# Patient Record
Sex: Female | Born: 1985 | Race: Black or African American | Hispanic: No | Marital: Single | Smoking: Never smoker
Health system: Southern US, Community
[De-identification: ages and names within clinical notes are randomized; demographics above are authoritative.]

## PROBLEM LIST (undated history)

## (undated) ENCOUNTER — Inpatient Hospital Stay: Payer: Self-pay

## (undated) DIAGNOSIS — N200 Calculus of kidney: Secondary | ICD-10-CM

## (undated) DIAGNOSIS — G43909 Migraine, unspecified, not intractable, without status migrainosus: Secondary | ICD-10-CM

## (undated) HISTORY — PX: LITHOTRIPSY: SUR834

## (undated) HISTORY — PX: DILATION AND CURETTAGE OF UTERUS: SHX78

---
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
## 2007-08-18 ENCOUNTER — Emergency Department: Payer: Self-pay | Admitting: Emergency Medicine

## 2007-10-12 ENCOUNTER — Emergency Department: Payer: Self-pay | Admitting: Emergency Medicine

## 2007-11-05 ENCOUNTER — Emergency Department: Payer: Self-pay | Admitting: Emergency Medicine

## 2008-03-30 ENCOUNTER — Observation Stay: Payer: Self-pay | Admitting: Obstetrics and Gynecology

## 2008-04-17 ENCOUNTER — Inpatient Hospital Stay: Payer: Self-pay

## 2008-08-27 ENCOUNTER — Emergency Department: Payer: Self-pay | Admitting: Emergency Medicine

## 2008-11-19 LAB — HM PAP SMEAR: HM PAP: NORMAL

## 2009-06-30 ENCOUNTER — Emergency Department: Payer: Self-pay | Admitting: Emergency Medicine

## 2009-07-17 ENCOUNTER — Emergency Department: Payer: Self-pay | Admitting: Emergency Medicine

## 2009-07-19 ENCOUNTER — Emergency Department: Payer: Self-pay | Admitting: Emergency Medicine

## 2010-04-01 ENCOUNTER — Ambulatory Visit: Payer: Self-pay | Admitting: Advanced Practice Midwife

## 2010-04-15 ENCOUNTER — Encounter: Payer: Self-pay | Admitting: Obstetrics and Gynecology

## 2010-05-28 ENCOUNTER — Observation Stay: Payer: Self-pay | Admitting: Unknown Physician Specialty

## 2010-05-30 ENCOUNTER — Inpatient Hospital Stay: Payer: Self-pay | Admitting: Obstetrics & Gynecology

## 2010-07-16 ENCOUNTER — Emergency Department: Payer: Self-pay | Admitting: Emergency Medicine

## 2010-08-03 ENCOUNTER — Emergency Department: Payer: Self-pay | Admitting: Emergency Medicine

## 2010-11-25 ENCOUNTER — Emergency Department: Payer: Self-pay | Admitting: Emergency Medicine

## 2010-11-29 ENCOUNTER — Emergency Department: Payer: Self-pay | Admitting: Emergency Medicine

## 2011-01-13 ENCOUNTER — Emergency Department: Payer: Self-pay | Admitting: Emergency Medicine

## 2011-01-24 ENCOUNTER — Emergency Department: Payer: Self-pay | Admitting: Emergency Medicine

## 2011-06-27 ENCOUNTER — Emergency Department: Payer: Self-pay | Admitting: Emergency Medicine

## 2011-07-05 ENCOUNTER — Emergency Department: Payer: Self-pay | Admitting: Emergency Medicine

## 2011-07-13 ENCOUNTER — Emergency Department: Payer: Self-pay | Admitting: Emergency Medicine

## 2011-07-28 ENCOUNTER — Emergency Department: Payer: Self-pay | Admitting: Emergency Medicine

## 2011-08-15 ENCOUNTER — Emergency Department: Payer: Self-pay | Admitting: Emergency Medicine

## 2011-08-27 ENCOUNTER — Emergency Department: Payer: Self-pay | Admitting: Emergency Medicine

## 2011-10-01 ENCOUNTER — Emergency Department: Payer: Self-pay | Admitting: Emergency Medicine

## 2011-10-18 ENCOUNTER — Emergency Department: Payer: Self-pay | Admitting: *Deleted

## 2011-10-18 LAB — URINALYSIS, COMPLETE
Glucose,UR: NEGATIVE mg/dL (ref 0–75)
Ketone: NEGATIVE
Nitrite: NEGATIVE
Ph: 7 (ref 4.5–8.0)
RBC,UR: 52 /HPF (ref 0–5)
Specific Gravity: 1.023 (ref 1.003–1.030)
Squamous Epithelial: 12

## 2011-10-24 ENCOUNTER — Emergency Department: Payer: Self-pay | Admitting: *Deleted

## 2011-10-24 LAB — URINALYSIS, COMPLETE
Bilirubin,UR: NEGATIVE
Glucose,UR: NEGATIVE mg/dL (ref 0–75)
Nitrite: NEGATIVE
Protein: NEGATIVE
RBC,UR: 27 /HPF (ref 0–5)
Specific Gravity: 1.005 (ref 1.003–1.030)
Squamous Epithelial: 3
WBC UR: 26 /HPF (ref 0–5)

## 2011-10-24 LAB — HCG, QUANTITATIVE, PREGNANCY: Beta Hcg, Quant.: 25578 m[IU]/mL — ABNORMAL HIGH

## 2011-12-25 ENCOUNTER — Observation Stay: Payer: Self-pay | Admitting: Obstetrics and Gynecology

## 2011-12-28 LAB — HM HIV SCREENING LAB: HM HIV Screening: NEGATIVE

## 2012-01-05 ENCOUNTER — Emergency Department: Payer: Self-pay | Admitting: Emergency Medicine

## 2012-01-05 LAB — CBC
HCT: 35 % (ref 35.0–47.0)
HGB: 11.7 g/dL — ABNORMAL LOW (ref 12.0–16.0)
MCH: 33.6 pg (ref 26.0–34.0)
MCHC: 33.3 g/dL (ref 32.0–36.0)
MCV: 101 fL — ABNORMAL HIGH (ref 80–100)
Platelet: 167 10*3/uL (ref 150–440)
RDW: 14.5 % (ref 11.5–14.5)

## 2012-01-05 LAB — COMPREHENSIVE METABOLIC PANEL
Alkaline Phosphatase: 76 U/L (ref 50–136)
Anion Gap: 7 (ref 7–16)
BUN: 6 mg/dL — ABNORMAL LOW (ref 7–18)
Bilirubin,Total: 0.5 mg/dL (ref 0.2–1.0)
Calcium, Total: 8.8 mg/dL (ref 8.5–10.1)
Creatinine: 0.63 mg/dL (ref 0.60–1.30)
EGFR (African American): >60
Glucose: 71 mg/dL (ref 65–99)
Osmolality: 270 (ref 275–301)
Potassium: 3.6 mmol/L (ref 3.5–5.1)
SGOT(AST): 17 U/L (ref 15–37)

## 2012-01-05 LAB — APTT: Activated PTT: 26.4 secs (ref 23.6–35.9)

## 2012-01-05 LAB — TROPONIN I: Troponin-I: <0.02 ng/mL

## 2012-01-05 LAB — CK TOTAL AND CKMB (NOT AT ARMC): CK-MB: 0.6 ng/mL (ref 0.5–3.6)

## 2012-02-05 ENCOUNTER — Observation Stay: Payer: Self-pay

## 2012-03-07 ENCOUNTER — Observation Stay: Payer: Self-pay

## 2012-03-10 ENCOUNTER — Observation Stay: Payer: Self-pay | Admitting: Advanced Practice Midwife

## 2012-03-11 ENCOUNTER — Observation Stay: Payer: Self-pay | Admitting: Advanced Practice Midwife

## 2012-03-12 ENCOUNTER — Observation Stay: Payer: Self-pay

## 2012-03-18 ENCOUNTER — Inpatient Hospital Stay: Payer: Self-pay | Admitting: Obstetrics and Gynecology

## 2012-03-18 LAB — CBC WITH DIFFERENTIAL/PLATELET
Basophil %: 0.5 %
Eosinophil #: 0.1 10*3/uL (ref 0.0–0.7)
HCT: 39.2 % (ref 35.0–47.0)
HGB: 13 g/dL (ref 12.0–16.0)
Lymphocyte %: 20.6 %
MCHC: 33.2 g/dL (ref 32.0–36.0)
MCV: 101 fL — ABNORMAL HIGH (ref 80–100)
Monocyte #: 0.5 x10 3/mm (ref 0.2–0.9)
Monocyte %: 6.8 %
Neutrophil %: 70.7 %
Platelet: 117 10*3/uL — ABNORMAL LOW (ref 150–440)
RBC: 3.88 10*6/uL (ref 3.80–5.20)
RDW: 13.6 % (ref 11.5–14.5)
WBC: 7 10*3/uL (ref 3.6–11.0)

## 2012-03-19 LAB — HEMATOCRIT: HCT: 35.4 % (ref 35.0–47.0)

## 2012-07-30 ENCOUNTER — Emergency Department: Payer: Self-pay | Admitting: Emergency Medicine

## 2012-07-30 LAB — URINALYSIS, COMPLETE
Bacteria: NONE SEEN
Bilirubin,UR: NEGATIVE
Glucose,UR: NEGATIVE mg/dL (ref 0–75)
Leukocyte Esterase: NEGATIVE
Protein: NEGATIVE
RBC,UR: 487 /HPF (ref 0–5)
Specific Gravity: 1.016 (ref 1.003–1.030)

## 2012-07-30 LAB — PREGNANCY, URINE: Pregnancy Test, Urine: NEGATIVE m[IU]/mL

## 2012-10-13 ENCOUNTER — Emergency Department: Payer: Self-pay | Admitting: Emergency Medicine

## 2012-10-13 LAB — CBC
HCT: 37.9 % (ref 35.0–47.0)
MCH: 31.9 pg (ref 26.0–34.0)
MCHC: 32.8 g/dL (ref 32.0–36.0)
MCV: 97 fL (ref 80–100)
Platelet: 182 10*3/uL (ref 150–440)
RDW: 12.9 % (ref 11.5–14.5)
WBC: 4.8 10*3/uL (ref 3.6–11.0)

## 2012-10-13 LAB — URINALYSIS, COMPLETE
Bilirubin,UR: NEGATIVE
Glucose,UR: NEGATIVE mg/dL (ref 0–75)
RBC,UR: <1 /HPF (ref 0–5)
WBC UR: 3 /HPF (ref 0–5)

## 2012-10-13 LAB — BASIC METABOLIC PANEL
BUN: 10 mg/dL (ref 7–18)
Calcium, Total: 8.8 mg/dL (ref 8.5–10.1)
Co2: 29 mmol/L (ref 21–32)
Creatinine: 0.82 mg/dL (ref 0.60–1.30)
EGFR (African American): >60
Osmolality: 283 (ref 275–301)

## 2012-11-26 ENCOUNTER — Emergency Department: Payer: Self-pay | Admitting: Emergency Medicine

## 2012-11-29 ENCOUNTER — Emergency Department: Payer: Self-pay | Admitting: Emergency Medicine

## 2013-01-29 ENCOUNTER — Emergency Department: Payer: Self-pay | Admitting: Emergency Medicine

## 2013-04-08 ENCOUNTER — Emergency Department: Payer: Self-pay | Admitting: Emergency Medicine

## 2013-04-08 LAB — COMPREHENSIVE METABOLIC PANEL
Albumin: 3.7 g/dL (ref 3.4–5.0)
Alkaline Phosphatase: 54 U/L (ref 50–136)
Anion Gap: 6 — ABNORMAL LOW (ref 7–16)
BUN: 12 mg/dL (ref 7–18)
Calcium, Total: 9.5 mg/dL (ref 8.5–10.1)
Co2: 27 mmol/L (ref 21–32)
EGFR (Non-African Amer.): >60
Glucose: 83 mg/dL (ref 65–99)
Osmolality: 271 (ref 275–301)
Potassium: 3.9 mmol/L (ref 3.5–5.1)
SGPT (ALT): 15 U/L (ref 12–78)
Sodium: 136 mmol/L (ref 136–145)

## 2013-04-08 LAB — URINALYSIS, COMPLETE
Bacteria: NONE SEEN
Bilirubin,UR: NEGATIVE
Blood: NEGATIVE
Glucose,UR: NEGATIVE mg/dL (ref 0–75)
Ketone: NEGATIVE
Nitrite: NEGATIVE
Ph: 6 (ref 4.5–8.0)
Protein: NEGATIVE
Specific Gravity: 1.018 (ref 1.003–1.030)
WBC UR: 2 /HPF (ref 0–5)

## 2013-04-08 LAB — CBC
MCH: 32.1 pg (ref 26.0–34.0)
MCHC: 33.7 g/dL (ref 32.0–36.0)
Platelet: 199 10*3/uL (ref 150–440)

## 2013-04-08 LAB — PREGNANCY, URINE: Pregnancy Test, Urine: POSITIVE m[IU]/mL

## 2013-04-08 LAB — HCG, QUANTITATIVE, PREGNANCY: Beta Hcg, Quant.: 28406 m[IU]/mL — ABNORMAL HIGH

## 2013-09-05 ENCOUNTER — Emergency Department: Payer: Self-pay | Admitting: Emergency Medicine

## 2013-10-06 ENCOUNTER — Emergency Department: Payer: Self-pay | Admitting: Emergency Medicine

## 2013-12-26 ENCOUNTER — Emergency Department: Payer: Self-pay | Admitting: Emergency Medicine

## 2013-12-26 LAB — CBC
HCT: 38.3 % (ref 35.0–47.0)
HGB: 12.7 g/dL (ref 12.0–16.0)
MCH: 32.4 pg (ref 26.0–34.0)
MCHC: 33.2 g/dL (ref 32.0–36.0)
MCV: 98 fL (ref 80–100)
Platelet: 201 10*3/uL (ref 150–440)
RBC: 3.92 10*6/uL (ref 3.80–5.20)
RDW: 13.3 % (ref 11.5–14.5)
WBC: 5.7 10*3/uL (ref 3.6–11.0)

## 2013-12-26 LAB — HCG, QUANTITATIVE, PREGNANCY: Beta Hcg, Quant.: 21706 m[IU]/mL — ABNORMAL HIGH

## 2014-01-03 ENCOUNTER — Emergency Department: Payer: Self-pay | Admitting: Emergency Medicine

## 2014-01-03 LAB — CBC WITH DIFFERENTIAL/PLATELET
Basophil #: 0 10*3/uL (ref 0.0–0.1)
Basophil %: 1.2 %
Eosinophil #: 0.2 10*3/uL (ref 0.0–0.7)
Eosinophil %: 3.9 %
HCT: 38.8 % (ref 35.0–47.0)
HGB: 12.8 g/dL (ref 12.0–16.0)
Lymphocyte #: 1.5 10*3/uL (ref 1.0–3.6)
Lymphocyte %: 36.8 %
MCH: 32.3 pg (ref 26.0–34.0)
MCHC: 33.1 g/dL (ref 32.0–36.0)
MCV: 98 fL (ref 80–100)
Monocyte #: 0.4 x10 3/mm (ref 0.2–0.9)
Monocyte %: 8.7 %
NEUTROS ABS: 2 10*3/uL (ref 1.4–6.5)
NEUTROS PCT: 49.4 %
Platelet: 214 10*3/uL (ref 150–440)
RBC: 3.98 10*6/uL (ref 3.80–5.20)
RDW: 13.4 % (ref 11.5–14.5)
WBC: 4 10*3/uL (ref 3.6–11.0)

## 2014-01-03 LAB — URINALYSIS, COMPLETE
BACTERIA: NONE SEEN
BILIRUBIN, UR: NEGATIVE
Glucose,UR: NEGATIVE mg/dL (ref 0–75)
Ketone: NEGATIVE
Nitrite: NEGATIVE
Ph: 5 (ref 4.5–8.0)
Protein: 30
Specific Gravity: 1.029 (ref 1.003–1.030)
WBC UR: 7 /HPF (ref 0–5)

## 2014-01-03 LAB — COMPREHENSIVE METABOLIC PANEL
ALT: 13 U/L (ref 12–78)
Albumin: 3.9 g/dL (ref 3.4–5.0)
Alkaline Phosphatase: 46 U/L
Anion Gap: 5 — ABNORMAL LOW (ref 7–16)
BILIRUBIN TOTAL: 0.5 mg/dL (ref 0.2–1.0)
BUN: 15 mg/dL (ref 7–18)
CALCIUM: 9.3 mg/dL (ref 8.5–10.1)
Chloride: 106 mmol/L (ref 98–107)
Co2: 25 mmol/L (ref 21–32)
Creatinine: 0.71 mg/dL (ref 0.60–1.30)
EGFR (Non-African Amer.): >60
Glucose: 82 mg/dL (ref 65–99)
Osmolality: 272 (ref 275–301)
Potassium: 4.4 mmol/L (ref 3.5–5.1)
SGOT(AST): 18 U/L (ref 15–37)
Sodium: 136 mmol/L (ref 136–145)
Total Protein: 8.2 g/dL (ref 6.4–8.2)

## 2014-02-03 ENCOUNTER — Emergency Department: Payer: Self-pay | Admitting: Emergency Medicine

## 2014-04-10 ENCOUNTER — Emergency Department: Payer: Self-pay

## 2014-04-10 LAB — COMPREHENSIVE METABOLIC PANEL
ALT: 12 U/L (ref 12–78)
AST: 14 U/L — AB (ref 15–37)
Albumin: 3.9 g/dL (ref 3.4–5.0)
Alkaline Phosphatase: 46 U/L
Anion Gap: 7 (ref 7–16)
BUN: 10 mg/dL (ref 7–18)
Bilirubin,Total: 1.2 mg/dL — ABNORMAL HIGH (ref 0.2–1.0)
CHLORIDE: 103 mmol/L (ref 98–107)
CO2: 28 mmol/L (ref 21–32)
Calcium, Total: 9.4 mg/dL (ref 8.5–10.1)
Creatinine: 0.92 mg/dL (ref 0.60–1.30)
EGFR (African American): >60
EGFR (Non-African Amer.): >60
Glucose: 81 mg/dL (ref 65–99)
OSMOLALITY: 274 (ref 275–301)
Potassium: 3.9 mmol/L (ref 3.5–5.1)
SODIUM: 138 mmol/L (ref 136–145)
Total Protein: 7.5 g/dL (ref 6.4–8.2)

## 2014-04-10 LAB — CBC WITH DIFFERENTIAL/PLATELET
BASOS PCT: 0.6 %
Basophil #: 0 10*3/uL (ref 0.0–0.1)
EOS ABS: 0.3 10*3/uL (ref 0.0–0.7)
Eosinophil %: 6.1 %
HCT: 40.5 % (ref 35.0–47.0)
HGB: 13.3 g/dL (ref 12.0–16.0)
LYMPHS ABS: 2.1 10*3/uL (ref 1.0–3.6)
Lymphocyte %: 37.9 %
MCH: 32.1 pg (ref 26.0–34.0)
MCHC: 32.8 g/dL (ref 32.0–36.0)
MCV: 98 fL (ref 80–100)
Monocyte #: 0.4 x10 3/mm (ref 0.2–0.9)
Monocyte %: 7.4 %
NEUTROS ABS: 2.7 10*3/uL (ref 1.4–6.5)
Neutrophil %: 48 %
Platelet: 185 10*3/uL (ref 150–440)
RBC: 4.14 10*6/uL (ref 3.80–5.20)
RDW: 13 % (ref 11.5–14.5)
WBC: 5.6 10*3/uL (ref 3.6–11.0)

## 2014-04-10 LAB — URINALYSIS, COMPLETE
BILIRUBIN, UR: NEGATIVE
Bacteria: NONE SEEN
Blood: NEGATIVE
Glucose,UR: NEGATIVE mg/dL (ref 0–75)
Ketone: NEGATIVE
Nitrite: NEGATIVE
Ph: 5 (ref 4.5–8.0)
Protein: NEGATIVE
RBC,UR: 2 /HPF (ref 0–5)
Specific Gravity: 1.016 (ref 1.003–1.030)
Squamous Epithelial: 17

## 2014-05-03 ENCOUNTER — Emergency Department: Payer: Self-pay | Admitting: Internal Medicine

## 2014-05-11 ENCOUNTER — Emergency Department: Payer: Self-pay | Admitting: Emergency Medicine

## 2014-05-11 LAB — COMPREHENSIVE METABOLIC PANEL
ALK PHOS: 39 U/L — AB
AST: 20 U/L (ref 15–37)
Albumin: 3.8 g/dL (ref 3.4–5.0)
Anion Gap: 4 — ABNORMAL LOW (ref 7–16)
BUN: 12 mg/dL (ref 7–18)
Bilirubin,Total: 1.4 mg/dL — ABNORMAL HIGH (ref 0.2–1.0)
CO2: 26 mmol/L (ref 21–32)
Calcium, Total: 8.7 mg/dL (ref 8.5–10.1)
Chloride: 108 mmol/L — ABNORMAL HIGH (ref 98–107)
Creatinine: 0.92 mg/dL (ref 0.60–1.30)
Glucose: 104 mg/dL — ABNORMAL HIGH (ref 65–99)
Osmolality: 276 (ref 275–301)
POTASSIUM: 3.8 mmol/L (ref 3.5–5.1)
SGPT (ALT): 12 U/L — ABNORMAL LOW
Sodium: 138 mmol/L (ref 136–145)
Total Protein: 7.8 g/dL (ref 6.4–8.2)

## 2014-05-11 LAB — URINALYSIS, COMPLETE
BILIRUBIN, UR: NEGATIVE
Blood: NEGATIVE
GLUCOSE, UR: NEGATIVE mg/dL (ref 0–75)
Ketone: NEGATIVE
Nitrite: NEGATIVE
PH: 7 (ref 4.5–8.0)
Protein: NEGATIVE
RBC,UR: 4 /HPF (ref 0–5)
Specific Gravity: 1.015 (ref 1.003–1.030)
Squamous Epithelial: 13
WBC UR: 13 /HPF (ref 0–5)

## 2014-05-11 LAB — CBC WITH DIFFERENTIAL/PLATELET
BASOS ABS: 0 10*3/uL (ref 0.0–0.1)
Basophil %: 1 %
EOS ABS: 0.3 10*3/uL (ref 0.0–0.7)
EOS PCT: 9.2 %
HCT: 41.9 % (ref 35.0–47.0)
HGB: 13.4 g/dL (ref 12.0–16.0)
LYMPHS ABS: 1.1 10*3/uL (ref 1.0–3.6)
Lymphocyte %: 36.3 %
MCH: 31.8 pg (ref 26.0–34.0)
MCHC: 32 g/dL (ref 32.0–36.0)
MCV: 99 fL (ref 80–100)
MONOS PCT: 7.5 %
Monocyte #: 0.2 x10 3/mm (ref 0.2–0.9)
Neutrophil #: 1.5 10*3/uL (ref 1.4–6.5)
Neutrophil %: 46 %
Platelet: 181 10*3/uL (ref 150–440)
RBC: 4.22 10*6/uL (ref 3.80–5.20)
RDW: 13.5 % (ref 11.5–14.5)
WBC: 3.2 10*3/uL — ABNORMAL LOW (ref 3.6–11.0)

## 2014-05-11 LAB — WET PREP, GENITAL

## 2014-05-11 LAB — GC/CHLAMYDIA PROBE AMP

## 2014-05-11 LAB — PREGNANCY, URINE: Pregnancy Test, Urine: NEGATIVE m[IU]/mL

## 2014-06-19 ENCOUNTER — Emergency Department: Payer: Self-pay | Admitting: Emergency Medicine

## 2014-06-29 ENCOUNTER — Emergency Department: Payer: Self-pay | Admitting: Emergency Medicine

## 2014-12-16 ENCOUNTER — Emergency Department: Payer: Self-pay | Admitting: Emergency Medicine

## 2015-02-17 NOTE — H&P (Signed)
L&D Evaluation:  History:   HPI 29 year old [id number], EDD of 03/18/12, presents to L&D at 40 weeks with c/o contractions. Pt was noted to be contracting irregularly every 10 to 11 minutes, her cervix as unchanged from 1 week ago and did not change over a 2-hr period of time.  Patient was discharged with Remus Lofflerambien but on re-presented prior to being completely discharged after having passed some blood per vagina.  PNC has been at [facility], notable for late entry to care, trich +, and depression.    Presents with contractions    Patient's Medical History No Chronic Illness  past depression    Patient's Surgical History none    Medications Pre Natal Vitamins    Allergies NKDA    Social History none    Family History Non-Contributory   ROS:   ROS see HPI   Exam:   Vital Signs stable    General no apparent distress    Mental Status clear    Abdomen gravid, non-tender    Estimated Fetal Weight Average for gestational age    Edema no edema    Pelvic no external lesions, 2/80/-2    Mebranes Intact    Description bloody, thick blood tinged mucous    FHT normal rate with no decels, good variability but non-reactive, likely secondary to [place]    Ucx irregular    Other Ferning slide negative   Impression:   Impression Vaginal bleeding   Plan:   Comments 1) Labor - no evidence of labor, contractions irregular, set up for IOL on 03/26/2012 2) Fetus - Category II tracing, will let patient sleep off ambien and monitr fetus until reactive 3) VB - likley secondary to cervical check, reasured, will monitor, has follow up HD on 03/19/12 if tracing becomes reactive    Follow Up Appointment already scheduled   Electronic Signatures: Lorrene ReidStaebler, Talvin Christianson M (MD)  (Signed 09-Jun-13 10:52)  Authored: L&D Evaluation   Last Updated: 09-Jun-13 10:52 by Lorrene ReidStaebler, Perfecto Purdy M (MD)

## 2015-02-17 NOTE — H&P (Signed)
L&D Evaluation:  History:   HPI 29 year old [id number] presents to L&D at 27 weeks c/o cramping, nausea, and headache. States she has had a headache off and on today, and some nausea earlier this evening, and thought she might be contracting.  PNC has been at [facility], notable for late entry to care, trich +, and depression. Pt was [facility] for Trich and [facility] early in pregnancy, then was found to still have it later in her pregnancy. She states she was told by [facility] that her trich couuld not be treated at this point in her pregnancy becasue it could cause preterm labor. Pt is currently being treated for UTI that was discovered at the health dept a few days ago. She has only taken 1 full day of abx (she believes she is taking Macrobid). She has a hx of 1 preterm delivery at 36 weeks due to spont labor.    Presents with nausea/vomiting, other, cramping    Patient's Medical History No Chronic Illness  past depression    Patient's Surgical History none    Medications Pre Natal Vitamins  macrobid    Allergies NKDA    Social History none    Family History Non-Contributory   ROS:   ROS see HPI   Exam:   Vital Signs stable    General no apparent distress    Mental Status clear    Abdomen gravid, non-tender    Estimated Fetal Weight Average for gestational age    Edema no edema    Mebranes Intact    FHT normal rate with no decels    Ucx absent   Impression:   Impression 34 week IUP, nausea, Trich   Plan:   Plan EFM/NST, other, flagyl 2 grams PO for trich, zofran 8 mg x1 for nausea    Comments noted in records that pt was trich + in beginning of April at [facility] and has never been treated, will treat now and given Zofran x1 to help with nausea. Pt just ate meal and has tolerated. Will dc to home after medications have been given. FHR tracing reactive No ctx's noted.   Electronic Signatures: Shella MaximPutnam, Starlin Steib (CNM)  (Signed 28-Apr-13 02:28)  Authored: L&D  Evaluation   Last Updated: 28-Apr-13 02:28 by Shella MaximPutnam, Zohan Shiflet (CNM)

## 2015-02-17 NOTE — H&P (Signed)
L&D Evaluation:  History Expanded:   HPI 29 year old [id number], EDD of 03/18/12, presents to L&D at 39 weeks with c/o contractions. Pt was seen last night and d/c home without cervical change. PNC has been at [facility], notable for late entry to care, trich +, and depression.    Blood Type A positive    Group B Strep Results (Result >5wks must be treated as unknown) negative    Maternal HIV Negative    Maternal Syphilis Ab Nonreactive    Maternal Varicella Immune    Rubella Results immune    Maternal T-Dap Immune    Presents with contractions    Patient's Medical History No Chronic Illness  past depression    Patient's Surgical History none    Medications Pre Natal Vitamins    Allergies NKDA    Social History none    Family History Non-Contributory   ROS:   ROS see HPI   Exam:   Vital Signs stable    General no apparent distress    Mental Status clear    Abdomen gravid, non-tender    Estimated Fetal Weight Average for gestational age    Edema no edema    Pelvic no external lesions, 2/80/-2    Mebranes Intact    FHT normal rate with no decels    Ucx irregular   Impression:   Impression prodromal/early labor   Plan:   Plan discharge    Comments Discussed normal labor progress and prodromal/early labor. Pt encouraged to rest and relax as much as possible, labor precautions.   Electronic Signatures: Vella KohlerBrothers, Lawrence Mitch K (CNM)  (Signed 02-Jun-13 19:17)  Authored: L&D Evaluation   Last Updated: 02-Jun-13 19:17 by Vella KohlerBrothers, Kelcey Korus K (CNM)

## 2015-02-17 NOTE — H&P (Signed)
L&D Evaluation:  History:   HPI 29 yo [name] at 6036w6d presenting with leakge of fluid since yesterday.  Noted increased vaginal secretions, no frank gush.  Has history of prior preterm delivery at 7115w2d was offered 17-P this pregnancy but declined.  Being followed with cervical lengths.  Patient was treated for Trichomonas earlier in pregnancy partner was never treated.  + FM, no LOF, no VB  A pos, ABSC neg, RI, RPR NR, HIV neg, HBsAg neg VZI    Presents with leaking fluid    Patient's Medical History No Chronic Illness    Patient's Surgical History none    Medications Pre Natal Vitamins    Allergies NKDA    Social History none    Family History Non-Contributory   ROS:   ROS All systems were reviewed.  HEENT, CNS, GI, GU, Respiratory, CV, Renal and Musculoskeletal systems were found to be normal.   Exam:   Vital Signs stable    Urine Protein not completed    General no apparent distress    Abdomen gravid, non-tender    Estimated Fetal Weight Average for gestational age    Edema no edema    Pelvic no external lesions, cervix closed and thick    Mebranes Intact    FHT normal rate with no decels    Ucx absent   Impression:   Impression Physiologic discharge   Plan:   Comments - No evidence of ROM by nitrazine - No trich, BV, or hyphea on wet mount - GC/CT sent - Has follow at HD this week   Electronic Signatures: Hayley Kirk, Hayley Kirk (MD)  (Signed 17-Mar-13 23:49)  Authored: L&D Evaluation   Last Updated: 17-Mar-13 23:49 by Hayley Kirk, Hayley Kirk (MD)

## 2015-02-19 ENCOUNTER — Emergency Department: Admission: EM | Admit: 2015-02-19 | Discharge: 2015-02-19 | Discharge status: Against Medical Advice | Payer: Self-pay

## 2015-02-20 ENCOUNTER — Emergency Department
Admission: EM | Admit: 2015-02-20 | Discharge: 2015-02-20 | Disposition: A | Discharge status: Home / Self Care | Payer: Self-pay | Attending: Emergency Medicine | Admitting: Emergency Medicine

## 2015-02-20 ENCOUNTER — Encounter: Payer: Self-pay | Admitting: Emergency Medicine

## 2015-02-20 ENCOUNTER — Emergency Department: Payer: Self-pay

## 2015-02-20 DIAGNOSIS — Z3A13 13 weeks gestation of pregnancy: Secondary | ICD-10-CM | POA: Insufficient documentation

## 2015-02-20 DIAGNOSIS — R51 Headache: Secondary | ICD-10-CM | POA: Insufficient documentation

## 2015-02-20 DIAGNOSIS — R252 Cramp and spasm: Secondary | ICD-10-CM | POA: Insufficient documentation

## 2015-02-20 DIAGNOSIS — M79605 Pain in left leg: Secondary | ICD-10-CM | POA: Insufficient documentation

## 2015-02-20 DIAGNOSIS — Z79899 Other long term (current) drug therapy: Secondary | ICD-10-CM | POA: Insufficient documentation

## 2015-02-20 DIAGNOSIS — O9989 Other specified diseases and conditions complicating pregnancy, childbirth and the puerperium: Secondary | ICD-10-CM | POA: Insufficient documentation

## 2015-02-20 MED ORDER — ACETAMINOPHEN 500 MG PO TABS
1000.0000 mg | ORAL_TABLET | Freq: Once | ORAL | Status: AC
  Administered 2015-02-20: 1000 mg via ORAL

## 2015-02-20 MED ORDER — ACETAMINOPHEN 500 MG PO TABS
ORAL_TABLET | ORAL | Status: AC
  Administered 2015-02-20: 1000 mg via ORAL
  Filled 2015-02-20: qty 2

## 2015-02-20 NOTE — ED Notes (Addendum)
EDP at bedside. Pt states left calf pain intermittently throughout the day X 4 days. Pain not worse with walking. Color WNL. No swelling. Temperature WDL. Mild headaches throughout the past few days. Pt [redacted] weeks pregnant.  Pt alert and oriented X4, active, cooperative, pt in NAD. RR even and unlabored, color WNL.

## 2015-02-20 NOTE — ED Notes (Signed)
Pt alert and oriented X4, active, cooperative, pt in NAD. RR even and unlabored, color WNL.  Pt informed to return if any life threatening symptoms occur.   

## 2015-02-20 NOTE — Discharge Instructions (Signed)
Muscle Cramps and Spasms  Ultrasound does not show any blood clot. Please follow-up with your doctor within the next 1-2 days for follow-up.  Return to the ER right away should he develop severe pain in her leg, redness, fever, a cold or numb foot, or other new concerns or symptoms arise.  You may use Tylenol as directed on the packaging for pain.   Muscle cramps and spasms occur when a muscle or muscles tighten and you have no control over this tightening (involuntary muscle contraction). They are a common problem and can develop in any muscle. The most common place is in the calf muscles of the leg. Both muscle cramps and muscle spasms are involuntary muscle contractions, but they also have differences:   Muscle cramps are sporadic and painful. They may last a few seconds to a quarter of an hour. Muscle cramps are often more forceful and last longer than muscle spasms.  Muscle spasms may or may not be painful. They may also last just a few seconds or much longer. CAUSES  It is uncommon for cramps or spasms to be due to a serious underlying problem. In many cases, the cause of cramps or spasms is unknown. Some common causes are:   Overexertion.   Overuse from repetitive motions (doing the same thing over and over).   Remaining in a certain position for a long period of time.   Improper preparation, form, or technique while performing a sport or activity.   Dehydration.   Injury.   Side effects of some medicines.   Abnormally low levels of the salts and ions in your blood (electrolytes), especially potassium and calcium. This could happen if you are taking water pills (diuretics) or you are pregnant.  Some underlying medical problems can make it more likely to develop cramps or spasms. These include, but are not limited to:   Diabetes.   Parkinson disease.   Hormone disorders, such as thyroid problems.   Alcohol abuse.   Diseases specific to muscles, joints, and  bones.   Blood vessel disease where not enough blood is getting to the muscles.  HOME CARE INSTRUCTIONS   Stay well hydrated. Drink enough water and fluids to keep your urine clear or pale yellow.  It may be helpful to massage, stretch, and relax the affected muscle.  For tight or tense muscles, use a warm towel, heating pad, or hot shower water directed to the affected area.  If you are sore or have pain after a cramp or spasm, applying ice to the affected area may relieve discomfort.  Put ice in a plastic bag.  Place a towel between your skin and the bag.  Leave the ice on for 15-20 minutes, 03-04 times a day.  Medicines used to treat a known cause of cramps or spasms may help reduce their frequency or severity. Only take over-the-counter or prescription medicines as directed by your caregiver. SEEK MEDICAL CARE IF:  Your cramps or spasms get more severe, more frequent, or do not improve over time.  MAKE SURE YOU:   Understand these instructions.  Will watch your condition.  Will get help right away if you are not doing well or get worse. Document Released: 03/18/2002 Document Revised: 01/21/2013 Document Reviewed: 09/12/2012 [facility] Patient Information 2015 [employer] [place]. This information is not intended to replace advice given to you by your health care provider. Make sure you discuss any questions you have with your health care provider.

## 2015-02-20 NOTE — ED Notes (Signed)
Patient is [redacted] weeks pregnant. C/o headache and left calf pain. States that she has a hx of blood clots.

## 2015-02-20 NOTE — ED Notes (Signed)
Patient transported to Ultrasound 

## 2015-02-20 NOTE — ED Provider Notes (Signed)
[facility] Emergency Department Provider Note  ____________________________________________  Time seen: Approximately 7:53 AM  I have reviewed the triage vital signs and the nursing notes.   HISTORY  Chief Complaint Headache and Leg Pain    HPI Arnoldo MoraleSarah G Blatchley is a 29 y.o. female who presents with concerns of achiness in her left lower leg. Patient states off and on for about the last week she has occasional aches in the left calf. She said sheduring her previous pregnancy as well. She denies any numbness or tingling or weakness of the leg. No injury. No skin changes. She states presently her pain is gone, but she occasionally the achiness that last about an hour. She wonders if this could potentially be muscle cramps.  In addition the patient notes that she gets mild headaches that she describes as achy across the bifrontal lobes. She states she has had history of previous pregnancies as well. She denies any numbness tingling, weakness, vomiting, or severe headache.  Patient does have a prior history of a "blood clot" once when she was 20. However, she also states that she does not believe she was ever needing treatment or on medications for it. Is very unclear to me what this could represent.  No vaginal discharge. No leakage of fluid. No abdominal pain.  [redacted] weeks pregnant   History reviewed. No pertinent past medical history.  There are no active problems to display for this patient.   Past Surgical History  Procedure Laterality Date  . Dilation and curettage of uterus      Current Outpatient Rx  Name  Route  Sig  Dispense  Refill  . Prenatal Vit-Fe Fumarate-FA (PRENATAL MULTIVITAMIN) TABS tablet   Oral   Take 1 tablet by mouth daily at 12 noon.           Allergies Review of patient's allergies indicates no known allergies.  No family history on file.  Social History History  Substance Use Topics  . Smoking status: Never Smoker   .  Smokeless tobacco: Not on file  . Alcohol Use: No    Review of Systems Constitutional: No fever/chills Eyes: No visual changes. ENT: No sore throat. Cardiovascular: Denies chest pain. Respiratory: Denies shortness of breath. Gastrointestinal: No abdominal pain.  No nausea, no vomiting.  No diarrhea.  No constipation. Genitourinary: Negative for dysuria. Musculoskeletal: Negative for back pain. Skin: Negative for rash. Neurological: Negative for headaches, focal weakness or numbness.  10-point ROS otherwise negative.  ____________________________________________   PHYSICAL EXAM:  VITAL SIGNS: ED Triage Vitals  Enc Vitals Group     BP 02/20/15 0724 122/65 mmHg     Pulse Rate 02/20/15 0724 69     Resp 02/20/15 0724 16     Temp 02/20/15 0724 98.3 F (36.8 C)     Temp Source 02/20/15 0724 Oral     SpO2 02/20/15 0724 96 %     Weight 02/20/15 0724 135 lb (61.236 kg)     Height 02/20/15 0724 5\' 4"  (1.626 m)     Head Cir --      Peak Flow --      Pain Score 02/20/15 0725 7     Pain Loc --      Pain Edu? --      Excl. in GC? --     Constitutional: Alert and oriented. Well appearing and in no acute distress. Eyes: Conjunctivae are normal. PERRL. EOMI. Head: Atraumatic. Nose: No congestion/rhinnorhea. Mouth/Throat: Mucous membranes are moist.  Oropharynx non-erythematous. Neck: No stridor.   Cardiovascular: Normal rate, regular rhythm. Grossly normal heart sounds.  Good peripheral circulation. Respiratory: Normal respiratory effort.  No retractions. Lungs CTAB. Gastrointestinal: Soft and nontender. No distention. No abdominal bruits. No CVA tenderness. Musculoskeletal: No lower extremity tenderness nor edema.  No joint effusions. Normal exam motor function and neurologic evaluation of the left lower and right lower extremities. Strong pulses bilaterally. No evidence of edema swelling, or calf tenderness.  Neurologic:  Normal speech and language. No gross focal neurologic  deficits are appreciated. Speech is normal. No gait instability. Skin:  Skin is warm, dry and intact. No rash noted. Psychiatric: Mood and affect are normal. Speech and behavior are normal.  ____________________________________________   LABS (all labs ordered are listed, but only abnormal results are displayed)  Labs Reviewed - No data to display ____________________________________________  EKG   ____________________________________________  RADIOLOGY No evidence of DVT  ____________________________________________   PROCEDURES  Procedure(s) performed: None  Critical Care performed: No  ____________________________________________   INITIAL IMPRESSION / ASSESSMENT AND PLAN / ED COURSE  Pertinent labs & imaging results that were available during my care of the patient were reviewed by me and considered in my medical decision making (see chart for details).  Occasional aches in the left calf muscle. Normal exam at this time. She does have a questionable history of some type blood clot is a teen, though it is not clear that he has she ever had a sort of DVT.  We will train in THE LEFT LOWER EXTREMITY TO EVALUATE FOR THE POSSIBILITY AND EXCLUDE DVT. OTHERWISE, AT THIS TIME HER PAIN SYMPTOMS ARE ALL RESOLVED.  I discussed the patient, and given her pregnancy status is probable that she has a mild strain left calf for occasionally having some muscle aches. She will treated with Tylenol, heat and follow-up with her OB doctor. She'll return to the ER right away should any concerning symptoms arise such as swelling of the leg, numbness, tingling, weakness or severe pain..   ____________________________________________   FINAL CLINICAL IMPRESSION(S) / ED DIAGNOSES  Final diagnoses:  None   left lower extremity cramping, resolved initial acute    Sharyn CreamerMark Quale, MD 02/20/15 1057

## 2015-02-23 ENCOUNTER — Emergency Department: Payer: Medicaid Other

## 2015-02-23 ENCOUNTER — Encounter: Payer: Self-pay | Admitting: Emergency Medicine

## 2015-02-23 ENCOUNTER — Emergency Department
Admission: EM | Admit: 2015-02-23 | Discharge: 2015-02-23 | Disposition: A | Discharge status: Home / Self Care | Payer: Medicaid Other | Attending: Emergency Medicine | Admitting: Emergency Medicine

## 2015-02-23 DIAGNOSIS — Z79899 Other long term (current) drug therapy: Secondary | ICD-10-CM | POA: Insufficient documentation

## 2015-02-23 DIAGNOSIS — O200 Threatened abortion: Secondary | ICD-10-CM | POA: Insufficient documentation

## 2015-02-23 DIAGNOSIS — Z3A11 11 weeks gestation of pregnancy: Secondary | ICD-10-CM | POA: Insufficient documentation

## 2015-02-23 DIAGNOSIS — O209 Hemorrhage in early pregnancy, unspecified: Secondary | ICD-10-CM

## 2015-02-23 LAB — URINALYSIS COMPLETE WITH MICROSCOPIC (ARMC ONLY)
BACTERIA UA: NONE SEEN
Bilirubin Urine: NEGATIVE
Glucose, UA: NEGATIVE mg/dL
KETONES UR: NEGATIVE mg/dL
Leukocytes, UA: NEGATIVE
NITRITE: NEGATIVE
Protein, ur: NEGATIVE mg/dL
RBC / HPF: NONE SEEN RBC/hpf (ref 0–5)
Specific Gravity, Urine: 1.005 (ref 1.005–1.030)
WBC UA: NONE SEEN WBC/hpf (ref 0–5)
pH: 7 (ref 5.0–8.0)

## 2015-02-23 LAB — CBC
HCT: 36.6 % (ref 35.0–47.0)
Hemoglobin: 12.2 g/dL (ref 12.0–16.0)
MCH: 32.3 pg (ref 26.0–34.0)
MCHC: 33.3 g/dL (ref 32.0–36.0)
MCV: 97.1 fL (ref 80.0–100.0)
PLATELETS: 170 10*3/uL (ref 150–440)
RBC: 3.77 MIL/uL — ABNORMAL LOW (ref 3.80–5.20)
RDW: 13.1 % (ref 11.5–14.5)
WBC: 5.1 10*3/uL (ref 3.6–11.0)

## 2015-02-23 LAB — ABO/RH
ABO/RH(D): A POS
ABO/RH(D): A POS

## 2015-02-23 LAB — POCT PREGNANCY, URINE: PREG TEST UR: POSITIVE — AB

## 2015-02-23 LAB — HCG, QUANTITATIVE, PREGNANCY: hCG, Beta Chain, Quant, S: 62646 m[IU]/mL — ABNORMAL HIGH (ref <5)

## 2015-02-23 NOTE — ED Notes (Signed)
Patient presents to ED with complaints of vaginal bleeding and cramping. Reports she is [redacted] weeks pregnant; reports onset of symptoms this a.m. Reports hx of 1 prior miscarriage at 13 weeks.

## 2015-02-23 NOTE — ED Provider Notes (Signed)
[facility] Emergency Department Provider Note  Time seen: 3:33 PM  I have reviewed the triage vital signs and the nursing notes.   HISTORY  Chief Complaint Vaginal Bleeding    HPI Hayley Kirk is a 29 y.o. female approximately [redacted] weeks pregnant by LMP presents the emergency department 1 day of vaginal bleeding and mild cramping. According to the patient she has been seen at the health Department but has not yet had an ultrasound performed. Patient states today she has had mild vaginal bleeding with some clot. She also notes mild pelvic cramping. She states the bleeding cramping have largely resolved at this time.     History reviewed. No pertinent past medical history.  There are no active problems to display for this patient.   Past Surgical History  Procedure Laterality Date  . Dilation and curettage of uterus      Current Outpatient Rx  Name  Route  Sig  Dispense  Refill  . Prenatal Vit-Fe Fumarate-FA (PRENATAL MULTIVITAMIN) TABS tablet   Oral   Take 1 tablet by mouth daily at 12 noon.           Allergies Review of patient's allergies indicates no known allergies.  History reviewed. No pertinent family history.  Social History History  Substance Use Topics  . Smoking status: Never Smoker   . Smokeless tobacco: Not on file  . Alcohol Use: No    Review of Systems Constitutional: Negative for fever. Cardiovascular: Negative for chest pain. Respiratory: Negative for shortness of breath. Gastrointestinal: Mild lower abdominal cramping negative for vomiting/diarrhea Genitourinary: Negative for dysuria. Musculoskeletal: Negative for back pain.  10-point ROS otherwise negative.  ____________________________________________   PHYSICAL EXAM:  VITAL SIGNS: ED Triage Vitals  Enc Vitals Group     BP 02/23/15 1322 114/83 mmHg     Pulse Rate 02/23/15 1322 67     Resp 02/23/15 1322 18     Temp 02/23/15 1322 98.2 F (36.8 C)   Temp Source 02/23/15 1322 Oral     SpO2 02/23/15 1322 100 %     Weight 02/23/15 1311 136 lb (61.689 kg)     Height 02/23/15 1311 5\' 4"  (1.626 m)     Head Cir --      Peak Flow --      Pain Score 02/23/15 1311 9     Pain Loc --      Pain Edu? --      Excl. in GC? --     Constitutional: Alert and oriented. Well appearing and in no distress. Eyes: Normal exam ENT   Mouth/Throat: Mucous membranes are moist. Cardiovascular: Normal rate, regular rhythm. No murmurs Respiratory: Normal respiratory effort without tachypnea nor retractions. Breath sounds are clear Gastrointestinal: Soft and nontender. No distention.  Musculoskeletal: Nontender with normal range of motion in all extremities Neurologic:  Normal speech and language. No gross focal neurologic deficits Skin:  Skin is warm, dry and intact.  Psychiatric: Mood and affect are normal. Speech and behavior are normal.   ____________________________________________      RADIOLOGY  Ultrasound consistent with normal IUP at 15 weeks 3 days.  ____________________________________________   INITIAL IMPRESSION / ASSESSMENT AND PLAN / ED COURSE  Pertinent labs & imaging results that were available during my care of the patient were reviewed by me and considered in my medical decision making (see chart for details).  29 year old female proximal nail [redacted] weeks pregnant by LMP. Patient with mild vaginal bleeding/spotting which is now  stopped. Labs are within normal limits, we will obtain a quantitative hCG and an ultrasound to further evaluate. Patient agreeable to plan.  ----------------------------------------- 5:33 PM on 02/23/2015 -----------------------------------------  Ultrasound shows IUP at 15 weeks 3 days, labs within normal limits. Patient continues to be symptom free in the emergency department. We'll discharge with primary care/OB follow-up. ____________________________________________   FINAL CLINICAL IMPRESSION(S) /  ED DIAGNOSES  Threatened miscarriage   Minna AntisKevin Fread Kottke, MD 02/23/15 1734

## 2015-02-23 NOTE — Discharge Instructions (Signed)

## 2015-02-23 NOTE — ED Notes (Signed)
POC pregnancy result is positive.

## 2015-07-09 ENCOUNTER — Emergency Department
Admission: EM | Admit: 2015-07-09 | Discharge: 2015-07-09 | Disposition: A | Discharge status: Home / Self Care | Payer: Medicaid Other | Attending: Emergency Medicine | Admitting: Emergency Medicine

## 2015-07-09 DIAGNOSIS — J069 Acute upper respiratory infection, unspecified: Secondary | ICD-10-CM | POA: Insufficient documentation

## 2015-07-09 DIAGNOSIS — Z3202 Encounter for pregnancy test, result negative: Secondary | ICD-10-CM | POA: Insufficient documentation

## 2015-07-09 DIAGNOSIS — Z79899 Other long term (current) drug therapy: Secondary | ICD-10-CM | POA: Insufficient documentation

## 2015-07-09 LAB — URINALYSIS COMPLETE WITH MICROSCOPIC (ARMC ONLY)
BACTERIA UA: NONE SEEN
Bilirubin Urine: NEGATIVE
Glucose, UA: NEGATIVE mg/dL
Hgb urine dipstick: NEGATIVE
KETONES UR: NEGATIVE mg/dL
Leukocytes, UA: NEGATIVE
NITRITE: NEGATIVE
Protein, ur: NEGATIVE mg/dL
SPECIFIC GRAVITY, URINE: 1.013 (ref 1.005–1.030)
pH: 5 (ref 5.0–8.0)

## 2015-07-09 LAB — RAPID INFLUENZA A&B ANTIGENS: Influenza B (ARMC): NOT DETECTED

## 2015-07-09 LAB — POCT RAPID STREP A: STREPTOCOCCUS, GROUP A SCREEN (DIRECT): NEGATIVE

## 2015-07-09 LAB — RAPID INFLUENZA A&B ANTIGENS (ARMC ONLY): INFLUENZA A (ARMC): NOT DETECTED

## 2015-07-09 LAB — POCT PREGNANCY, URINE: Preg Test, Ur: NEGATIVE

## 2015-07-09 MED ORDER — ACETAMINOPHEN-CODEINE #3 300-30 MG PO TABS: 1.0000 | ORAL_TABLET | Freq: Four times a day (QID) | ORAL | Status: DC | PRN

## 2015-07-09 MED ORDER — ONDANSETRON 4 MG PO TBDP
4.0000 mg | ORAL_TABLET | Freq: Once | ORAL | Status: AC
  Administered 2015-07-09: 4 mg via ORAL
  Filled 2015-07-09: qty 1

## 2015-07-09 MED ORDER — IBUPROFEN 800 MG PO TABS
800.0000 mg | ORAL_TABLET | Freq: Once | ORAL | Status: AC
  Administered 2015-07-09: 800 mg via ORAL
  Filled 2015-07-09: qty 1

## 2015-07-09 MED ORDER — BENZONATATE 100 MG PO CAPS: 100.0000 mg | ORAL_CAPSULE | Freq: Three times a day (TID) | ORAL | Status: DC | PRN

## 2015-07-09 MED ORDER — FLUTICASONE PROPIONATE 50 MCG/ACT NA SUSP: 1.0000 | Freq: Every day | NASAL | Status: DC

## 2015-07-09 MED ORDER — IBUPROFEN 800 MG PO TABS: 800.0000 mg | ORAL_TABLET | Freq: Three times a day (TID) | ORAL | Status: DC | PRN

## 2015-07-09 NOTE — ED Notes (Signed)
Pt in with co cold symptoms, bodyaches, chills, runny nose, congestion.  Took robitussin without relief.

## 2015-07-09 NOTE — Discharge Instructions (Signed)
Upper Respiratory Infection, Adult °An upper respiratory infection (URI) is also sometimes known as the common cold. The upper respiratory tract includes the nose, sinuses, throat, trachea, and bronchi. Bronchi are the airways leading to the lungs. Most people improve within 1 week, but symptoms can last up to 2 weeks. A residual cough may last even longer.  °CAUSES °Many different viruses can infect the tissues lining the upper respiratory tract. The tissues become irritated and inflamed and often become very moist. Mucus production is also common. A cold is contagious. You can easily spread the virus to others by oral contact. This includes kissing, sharing a glass, coughing, or sneezing. Touching your mouth or nose and then touching a surface, which is then touched by another person, can also spread the virus. °SYMPTOMS  °Symptoms typically develop 1 to 3 days after you come in contact with a cold virus. Symptoms vary from person to person. They may include: °· Runny nose. °· Sneezing. °· Nasal congestion. °· Sinus irritation. °· Sore throat. °· Loss of voice (laryngitis). °· Cough. °· Fatigue. °· Muscle aches. °· Loss of appetite. °· Headache. °· Low-grade fever. °DIAGNOSIS  °You might diagnose your own cold based on familiar symptoms, since most people get a cold 2 to 3 times a year. Your caregiver can confirm this based on your exam. Most importantly, your caregiver can check that your symptoms are not due to another disease such as strep throat, sinusitis, pneumonia, asthma, or epiglottitis. Blood tests, throat tests, and X-rays are not necessary to diagnose a common cold, but they may sometimes be helpful in excluding other more serious diseases. Your caregiver will decide if any further tests are required. °RISKS AND COMPLICATIONS  °You may be at risk for a more severe case of the common cold if you smoke cigarettes, have chronic heart disease (such as heart failure) or lung disease (such as asthma), or if  you have a weakened immune system. The very young and very old are also at risk for more serious infections. Bacterial sinusitis, middle ear infections, and bacterial pneumonia can complicate the common cold. The common cold can worsen asthma and chronic obstructive pulmonary disease (COPD). Sometimes, these complications can require emergency medical care and may be life-threatening. °PREVENTION  °The best way to protect against getting a cold is to practice good hygiene. Avoid oral or hand contact with people with cold symptoms. Wash your hands often if contact occurs. There is no clear evidence that vitamin C, vitamin E, echinacea, or exercise reduces the chance of developing a cold. However, it is always recommended to get plenty of rest and practice good nutrition. °TREATMENT  °Treatment is directed at relieving symptoms. There is no cure. Antibiotics are not effective, because the infection is caused by a virus, not by bacteria. Treatment may include: °· Increased fluid intake. Sports drinks offer valuable electrolytes, sugars, and fluids. °· Breathing heated mist or steam (vaporizer or shower). °· Eating chicken soup or other clear broths, and maintaining good nutrition. °· Getting plenty of rest. °· Using gargles or lozenges for comfort. °· Controlling fevers with ibuprofen or acetaminophen as directed by your caregiver. °· Increasing usage of your inhaler if you have asthma. °Zinc gel and zinc lozenges, taken in the first 24 hours of the common cold, can shorten the duration and lessen the severity of symptoms. Pain medicines may help with fever, muscle aches, and throat pain. A variety of non-prescription medicines are available to treat congestion and runny nose. Your caregiver   can make recommendations and may suggest nasal or lung inhalers for other symptoms.  HOME CARE INSTRUCTIONS   Only take over-the-counter or prescription medicines for pain, discomfort, or fever as directed by your  caregiver.  Use a warm mist humidifier or inhale steam from a shower to increase air moisture. This may keep secretions moist and make it easier to breathe.  Drink enough water and fluids to keep your urine clear or pale yellow.  Rest as needed.  Return to work when your temperature has returned to normal or as your caregiver advises. You may need to stay home longer to avoid infecting others. You can also use a face mask and careful hand washing to prevent spread of the virus. SEEK MEDICAL CARE IF:   After the first few days, you feel you are getting worse rather than better.  You need your caregiver's advice about medicines to control symptoms.  You develop chills, worsening shortness of breath, or brown or red sputum. These may be signs of pneumonia.  You develop yellow or brown nasal discharge or pain in the face, especially when you bend forward. These may be signs of sinusitis.  You develop a fever, swollen neck glands, pain with swallowing, or white areas in the back of your throat. These may be signs of strep throat. SEEK IMMEDIATE MEDICAL CARE IF:   You have a fever.  You develop severe or persistent headache, ear pain, sinus pain, or chest pain.  You develop wheezing, a prolonged cough, cough up blood, or have a change in your usual mucus (if you have chronic lung disease).  You develop sore muscles or a stiff neck. Document Released: 03/22/2001 Document Revised: 12/19/2011 Document Reviewed: 01/01/2014 [facility] Patient Information 2015 [place], [place]. This information is not intended to replace advice given to you by your health care provider. Make sure you discuss any questions you have with your health care provider.  Your rapid strep and flu tests were negative today. Continue to monitor symptoms and dose medicines as directed.  Follow-up with your provider or [facility] as needed. Increase fluid intake to prevent dehydration.

## 2015-07-09 NOTE — ED Provider Notes (Signed)
[facility] Emergency Department Provider Note ____________________________________________  Time seen: 2055  I have reviewed the triage vital signs and the nursing notes.  HISTORY  Chief Complaint  Chills  HPI Hayley Kirk is a 29 y.o. female reports to the ED for evaluation and management of cold symptoms for the last 2 days. She reports subjective fevers, sore throat and general body aches. She also notes of intermittent cough with some minimal production. Today she had the onset of some loose stools, but denies any abdominal pain, nausea, or vomiting. She denies any sick contacts, recent travel, or bad food. She is dosed Tylenol daily for symptom relief, and a single dose of an over-the-counter cough syrup today.She rates her pain at 8/10 in triage.  No past medical history on file.  There are no active problems to display for this patient.   Past Surgical History  Procedure Laterality Date  . Dilation and curettage of uterus      Current Outpatient Rx  Name  Route  Sig  Dispense  Refill  . acetaminophen-codeine (TYLENOL #3) 300-30 MG tablet   Oral   Take 1 tablet by mouth every 6 (six) hours as needed for moderate pain.   10 tablet   0   . benzonatate (TESSALON PERLES) 100 MG capsule   Oral   Take 1 capsule (100 mg total) by mouth 3 (three) times daily as needed for cough (Take 1-2 per dose).   30 capsule   0   . fluticasone (FLONASE) 50 MCG/ACT nasal spray   Each Nare   Place 1 spray into both nostrils daily.   16 g   0   . ibuprofen (ADVIL,MOTRIN) 800 MG tablet   Oral   Take 1 tablet (800 mg total) by mouth every 8 (eight) hours as needed.   30 tablet   0   . Prenatal Vit-Fe Fumarate-FA (PRENATAL MULTIVITAMIN) TABS tablet   Oral   Take 1 tablet by mouth daily at 12 noon.          Allergies Review of patient's allergies indicates no known allergies.  No family history on file.  Social History Social History  Substance Use  Topics  . Smoking status: Never Smoker   . Smokeless tobacco: Not on file  . Alcohol Use: No   Review of Systems  Constitutional: Reports subjective fever. Eyes: Negative for visual changes. ENT: Positive for sore throat. Cardiovascular: Negative for chest pain. Respiratory: Negative for shortness of breath. Gastrointestinal: Negative for abdominal pain, vomiting and diarrhea. Genitourinary: Negative for dysuria. Last menstrual period August 2016 Musculoskeletal: Negative for back pain. Skin: Negative for rash. Neurological: Negative for headaches, focal weakness or numbness. ____________________________________________  PHYSICAL EXAM:  VITAL SIGNS: ED Triage Vitals  Enc Vitals Group     BP 07/09/15 1955 117/77 mmHg     Pulse Rate 07/09/15 1955 108     Resp 07/09/15 1955 18     Temp 07/09/15 1955 98.7 F (37.1 C)     Temp Source 07/09/15 1955 Oral     SpO2 07/09/15 1955 97 %     Weight 07/09/15 1955 135 lb (61.236 kg)     Height 07/09/15 1955  (1.626 m)     Head Cir --      Peak Flow --      Pain Score 07/09/15 1955 8     Pain Loc --      Pain Edu? --      Excl. in  GC? --    Constitutional: Alert and oriented. Well appearing and in no distress. Eyes: Conjunctivae are normal. PERRL. Normal extraocular movements. ENT   Head: Normocephalic and atraumatic.   Nose: No congestion/rhinorrhea.   Mouth/Throat: Mucous membranes are moist. Uvula midline. Tonsils small without edema or exudate. Posterior pharynx with injection and edema consistent with post-nasal drainage.    Neck: Supple. No thyromegaly. Hematological/Lymphatic/Immunological: No cervical lymphadenopathy. Cardiovascular: Normal rate, regular rhythm.  Respiratory: Normal respiratory effort. No wheezes/rales/rhonchi. Gastrointestinal: Soft and nontender. No distention. Musculoskeletal: Nontender with normal range of motion in all extremities.  Neurologic:  Normal gait without ataxia. Normal  speech and language. No gross focal neurologic deficits are appreciated. Skin:  Skin is warm, dry and intact. No rash noted. Psychiatric: Mood and affect are normal. Patient exhibits appropriate insight and judgment. ____________________________________________   LABS (pertinent positives/negatives) Labs Reviewed  URINALYSIS COMPLETEWITH MICROSCOPIC ([facility] ONLY) - Abnormal; Notable for the following:    Color, Urine YELLOW (*)    APPearance CLEAR (*)    Squamous Epithelial / LPF 0-5 (*)    All other components within normal limits  INFLUENZA A&B ANTIGENS ([facility] ONLY)  POC URINE PREG, ED  POCT PREGNANCY, URINE  POCT RAPID STREP A  ____________________________________________  PROCEDURES  IBU 800 mg PO Zofran 4 mg ODT ____________________________________________  INITIAL IMPRESSION / ASSESSMENT AND PLAN / ED COURSE  Upper respiratory infection, left upper viral etiology. Will discharge patient home with prescriptions for Flonase, Tessalon Perles, ibuprofen 800, and Tylenol #3. She'll follow-up with primary care provider for ongoing symptoms. Return to the ED for acutely worsening symptoms. ____________________________________________  FINAL CLINICAL IMPRESSION(S) / ED DIAGNOSES  Final diagnoses:  Acute URI      Lissa Hoard, PA-C 07/09/15 2248  Minna Antis, MD 07/09/15 2251

## 2015-07-09 NOTE — ED Notes (Signed)
Strep rapid A test is negative.

## 2015-07-12 LAB — CULTURE, GROUP A STREP (THRC)

## 2015-09-01 LAB — HM PAP SMEAR: HM Pap smear: NEGATIVE

## 2015-09-06 ENCOUNTER — Encounter: Payer: Self-pay | Admitting: Emergency Medicine

## 2015-09-06 ENCOUNTER — Emergency Department
Admission: EM | Admit: 2015-09-06 | Discharge: 2015-09-06 | Disposition: A | Discharge status: Home / Self Care | Payer: Medicaid Other | Attending: Emergency Medicine | Admitting: Emergency Medicine

## 2015-09-06 DIAGNOSIS — S0003XA Contusion of scalp, initial encounter: Secondary | ICD-10-CM | POA: Insufficient documentation

## 2015-09-06 DIAGNOSIS — S0990XA Unspecified injury of head, initial encounter: Secondary | ICD-10-CM

## 2015-09-06 DIAGNOSIS — Z3A08 8 weeks gestation of pregnancy: Secondary | ICD-10-CM | POA: Insufficient documentation

## 2015-09-06 DIAGNOSIS — W2209XA Striking against other stationary object, initial encounter: Secondary | ICD-10-CM | POA: Insufficient documentation

## 2015-09-06 DIAGNOSIS — Y998 Other external cause status: Secondary | ICD-10-CM | POA: Insufficient documentation

## 2015-09-06 DIAGNOSIS — Z79899 Other long term (current) drug therapy: Secondary | ICD-10-CM | POA: Insufficient documentation

## 2015-09-06 DIAGNOSIS — O9A211 Injury, poisoning and certain other consequences of external causes complicating pregnancy, first trimester: Secondary | ICD-10-CM | POA: Insufficient documentation

## 2015-09-06 DIAGNOSIS — Z7951 Long term (current) use of inhaled steroids: Secondary | ICD-10-CM | POA: Insufficient documentation

## 2015-09-06 DIAGNOSIS — Y9289 Other specified places as the place of occurrence of the external cause: Secondary | ICD-10-CM | POA: Insufficient documentation

## 2015-09-06 DIAGNOSIS — S79912A Unspecified injury of left hip, initial encounter: Secondary | ICD-10-CM | POA: Insufficient documentation

## 2015-09-06 DIAGNOSIS — S3991XA Unspecified injury of abdomen, initial encounter: Secondary | ICD-10-CM | POA: Insufficient documentation

## 2015-09-06 DIAGNOSIS — Y9389 Activity, other specified: Secondary | ICD-10-CM | POA: Insufficient documentation

## 2015-09-06 LAB — POCT PREGNANCY, URINE: Preg Test, Ur: POSITIVE — AB

## 2015-09-06 MED ORDER — ACETAMINOPHEN 325 MG PO TABS
650.0000 mg | ORAL_TABLET | Freq: Once | ORAL | Status: AC
  Administered 2015-09-06: 650 mg via ORAL
  Filled 2015-09-06: qty 2

## 2015-09-06 NOTE — ED Notes (Signed)
Pt ambulatory to triage with no difficulty. Pt reports she got up from the bed and ran into a wall hitting her forehead on the temple area on the left side on a door frame. Pt also reports she is approx. [redacted] weeks pregnant and hit her abd and is having pain across her mid abd region.

## 2015-09-06 NOTE — ED Provider Notes (Signed)
[facility] Emergency Department Provider Note  ____________________________________________  Time seen: Approximately 4:41 AM  I have reviewed the triage vital signs and the nursing notes.   HISTORY  Chief Complaint Head Injury and Abdominal Pain    HPI Hayley Kirk is a 29 y.o. female who presents to the ED from home with a chief complaint of minor head injury. Incident occurred approximately one hour prior to arrival. Patient reports getting up from bed to get a drink. States she was half asleep, rounded the corner and struck her left temple against the corner of a door frame. Patient did not suffer loss of consciousness or fall to the ground. Denies associated neck pain, vision changes, dizziness, lightheadedness, nausea or vomiting. Complains of pain at left temple where she struck the door frame. Of note, patient is [redacted] weeks pregnant. Triage note states patient complained of abdominal pain. When I questioned patient about this, she denies striking abdomen, denies abdominal pain, denies vaginal bleeding or discharge.   Past medical history None   There are no active problems to display for this patient.   Past Surgical History  Procedure Laterality Date  . Dilation and curettage of uterus      Current Outpatient Rx  Name  Route  Sig  Dispense  Refill  . acetaminophen-codeine (TYLENOL #3) 300-30 MG tablet   Oral   Take 1 tablet by mouth every 6 (six) hours as needed for moderate pain.   10 tablet   0   . benzonatate (TESSALON PERLES) 100 MG capsule   Oral   Take 1 capsule (100 mg total) by mouth 3 (three) times daily as needed for cough (Take 1-2 per dose).   30 capsule   0   . fluticasone (FLONASE) 50 MCG/ACT nasal spray   Each Nare   Place 1 spray into both nostrils daily.   16 g   0   . ibuprofen (ADVIL,MOTRIN) 800 MG tablet   Oral   Take 1 tablet (800 mg total) by mouth every 8 (eight) hours as needed.   30 tablet   0   .  Prenatal Vit-Fe Fumarate-FA (PRENATAL MULTIVITAMIN) TABS tablet   Oral   Take 1 tablet by mouth daily at 12 noon.           Allergies Review of patient's allergies indicates no known allergies.  No family history on file.  Social History Social History  Substance Use Topics  . Smoking status: Never Smoker   . Smokeless tobacco: None  . Alcohol Use: No    Review of Systems Constitutional: No fever/chills Eyes: No visual changes. ENT: Positive for left hip pain. No sore throat. Cardiovascular: Denies chest pain. Respiratory: Denies shortness of breath. Gastrointestinal: No abdominal pain.  No nausea, no vomiting.  No diarrhea.  No constipation. Genitourinary: Negative for dysuria. Musculoskeletal: Negative for back pain. Skin: Negative for rash. Neurological: Negative for headaches, focal weakness or numbness.  10-point ROS otherwise negative.  ____________________________________________   PHYSICAL EXAM:  VITAL SIGNS: ED Triage Vitals  Enc Vitals Group     BP 09/06/15 0330 120/77 mmHg     Pulse Rate 09/06/15 0330 83     Resp 09/06/15 0330 18     Temp 09/06/15 0330 98.2 F (36.8 C)     Temp Source 09/06/15 0330 Oral     SpO2 09/06/15 0330 98 %     Weight 09/06/15 0330 145 lb (65.772 kg)     Height 09/06/15 0330 5'  4" (1.626 m)     Head Cir --      Peak Flow --      Pain Score 09/06/15 0330 9     Pain Loc --      Pain Edu? --      Excl. in GC? --     Constitutional: Alert and oriented. Well appearing and in no acute distress. Eyes: Conjunctivae are normal. PERRL. EOMI. Head: Very small area of ecchymosis to left temple. Nose: No congestion/rhinnorhea. Mouth/Throat: Mucous membranes are moist.  Oropharynx non-erythematous. Neck: No stridor.  No cervical spine tenderness to palpation. Cardiovascular: Normal rate, regular rhythm. Grossly normal heart sounds.  Good peripheral circulation. Respiratory: Normal respiratory effort.  No retractions. Lungs  CTAB. Gastrointestinal: Soft and nontender to light and deep palpation. No distention. No abdominal bruits. No CVA tenderness. Musculoskeletal: No lower extremity tenderness nor edema.  No joint effusions. Neurologic:  Normal speech and language. No gross focal neurologic deficits are appreciated. No gait instability. Skin:  Skin is warm, dry and intact. No rash noted. Psychiatric: Mood and affect are normal. Speech and behavior are normal.  ____________________________________________   LABS (all labs ordered are listed, but only abnormal results are displayed)  Labs Reviewed  POCT PREGNANCY, URINE - Abnormal; Notable for the following:    Preg Test, Ur POSITIVE (*)    All other components within normal limits   ____________________________________________  EKG  None ____________________________________________  RADIOLOGY  None ____________________________________________   PROCEDURES  Procedure(s) performed: None  Critical Care performed: No  ____________________________________________   INITIAL IMPRESSION / ASSESSMENT AND PLAN / ED COURSE  Pertinent labs & imaging results that were available during my care of the patient were reviewed by me and considered in my medical decision making (see chart for details).  29 year old female approximately [redacted] weeks pregnant with minor head injury from walking into a door frame. She had no loss of consciousness and there are no focal neurological deficits on exam. Strict return precautions given. Patient and spouse verbalize understanding and agree with plan of care. ____________________________________________   FINAL CLINICAL IMPRESSION(S) / ED DIAGNOSES  Final diagnoses:  Minor head injury, initial encounter  Contusion of left temporofrontal scalp, initial encounter      Irean HongJade J Michell Giuliano, MD 09/06/15 [phone]

## 2015-09-06 NOTE — ED Notes (Signed)
Urine set to lab

## 2015-09-06 NOTE — ED Notes (Signed)
Patient with no complaints at this time. Respirations even and unlabored. Skin warm/dry. Discharge instructions reviewed with patient at this time. Patient given opportunity to voice concerns/ask questions. Patient discharged at this time and left Emergency Department with steady gait.   

## 2015-09-06 NOTE — Discharge Instructions (Signed)
1. You may take Tylenol as needed for discomfort. 2. Apply ice to affected area several times daily. 3. Return to the ER for worsening symptoms, persistent vomiting, lethargy or other concerns.  Facial or Scalp Contusion A facial or scalp contusion is a deep bruise on the face or head. Injuries to the face and head generally cause a lot of swelling, especially around the eyes. Contusions are the result of an injury that caused bleeding under the skin. The contusion may turn blue, purple, or yellow. Minor injuries will give you a painless contusion, but more severe contusions may stay painful and swollen for a few weeks.  CAUSES  A facial or scalp contusion is caused by a blunt injury or trauma to the face or head area.  SIGNS AND SYMPTOMS   Swelling of the injured area.   Discoloration of the injured area.   Tenderness, soreness, or pain in the injured area.  DIAGNOSIS  The diagnosis can be made by taking a medical history and doing a physical exam. An X-ray exam, CT scan, or MRI may be needed to determine if there are any associated injuries, such as broken bones (fractures). TREATMENT  Often, the best treatment for a facial or scalp contusion is applying cold compresses to the injured area. Over-the-counter medicines may also be recommended for pain control.  HOME CARE INSTRUCTIONS   Only take over-the-counter or prescription medicines as directed by your health care provider.   Apply ice to the injured area.   Put ice in a plastic bag.   Place a towel between your skin and the bag.   Leave the ice on for 20 minutes, 2-3 times a day.  SEEK MEDICAL CARE IF:  You have bite problems.   You have pain with chewing.   You are concerned about facial defects. SEEK IMMEDIATE MEDICAL CARE IF:  You have severe pain or a headache that is not relieved by medicine.   You have unusual sleepiness, confusion, or personality changes.   You throw up (vomit).   You have a  persistent nosebleed.   You have double vision or blurred vision.   You have fluid drainage from your nose or ear.   You have difficulty walking or using your arms or legs.  MAKE SURE YOU:   Understand these instructions.  Will watch your condition.  Will get help right away if you are not doing well or get worse.   This information is not intended to replace advice given to you by your health care provider. Make sure you discuss any questions you have with your health care provider.   Document Released: 11/03/2004 Document Revised: 10/17/2014 Document Reviewed: 05/09/2013 [employer] Interactive Patient Education 2016 [employer]  Head Injury, Adult You have a head injury. Headaches and throwing up (vomiting) are common after a head injury. It should be easy to wake up from sleeping. Sometimes you must stay in the hospital. Most problems happen within the first 24 hours. Side effects may occur up to 7-10 days after the injury.  WHAT ARE THE TYPES OF HEAD INJURIES? Head injuries can be as minor as a bump. Some head injuries can be more severe. More severe head injuries include:  A jarring injury to the brain (concussion).  A bruise of the brain (contusion). This mean there is bleeding in the brain that can cause swelling.  A cracked skull (skull fracture).  Bleeding in the brain that collects, clots, and forms a bump (hematoma). WHEN SHOULD I GET  HELP RIGHT AWAY?   You are confused or sleepy.  You cannot be woken up.  You feel sick to your stomach (nauseous) or keep throwing up (vomiting).  Your dizziness or unsteadiness is getting worse.  You have very bad, lasting headaches that are not helped by medicine. Take medicines only as told by your doctor.  You cannot use your arms or legs like normal.  You cannot walk.  You notice changes in the black spots in the center of the colored part of your eye (pupil).  You have clear or bloody fluid coming from your nose or  ears.  You have trouble seeing. During the next 24 hours after the injury, you must stay with someone who can watch you. This person should get help right away (call 911 in the [place] if you start to shake and are not able to control it (have seizures), you pass out, or you are unable to wake up. HOW CAN I PREVENT A HEAD INJURY IN THE FUTURE?  Wear seat belts.  Wear a helmet while bike riding and playing sports like football.  Stay away from dangerous activities around the house. WHEN CAN I RETURN TO NORMAL ACTIVITIES AND ATHLETICS? See your doctor before doing these activities. You should not do normal activities or play contact sports until 1 week after the following symptoms have stopped:  Headache that does not go away.  Dizziness.  Poor attention.  Confusion.  Memory problems.  Sickness to your stomach or throwing up.  Tiredness.  Fussiness.  Bothered by bright lights or loud noises.  Anxiousness or depression.  Restless sleep. MAKE SURE YOU:   Understand these instructions.  Will watch your condition.  Will get help right away if you are not doing well or get worse.   This information is not intended to replace advice given to you by your health care provider. Make sure you discuss any questions you have with your health care provider.   Document Released: 09/08/2008 Document Revised: 10/17/2014 Document Reviewed: 06/03/2013 Else[employer] Interactive Patient Education [employer]

## 2015-09-14 ENCOUNTER — Encounter: Payer: Self-pay | Admitting: Certified Nurse Midwife

## 2015-09-16 ENCOUNTER — Ambulatory Visit (INDEPENDENT_AMBULATORY_CARE_PROVIDER_SITE_OTHER): Payer: Self-pay | Admitting: Obstetrics and Gynecology

## 2015-09-16 ENCOUNTER — Other Ambulatory Visit: Payer: Self-pay

## 2015-09-16 ENCOUNTER — Encounter: Payer: Self-pay | Admitting: Certified Nurse Midwife

## 2015-09-16 VITALS — BP 109/70 | HR 54 | Wt 144.5 lb

## 2015-09-16 DIAGNOSIS — Z331 Pregnant state, incidental: Secondary | ICD-10-CM

## 2015-09-16 DIAGNOSIS — Z369 Encounter for antenatal screening, unspecified: Secondary | ICD-10-CM

## 2015-09-16 DIAGNOSIS — Z1389 Encounter for screening for other disorder: Secondary | ICD-10-CM

## 2015-09-16 DIAGNOSIS — Z3490 Encounter for supervision of normal pregnancy, unspecified, unspecified trimester: Secondary | ICD-10-CM

## 2015-09-16 DIAGNOSIS — Z3687 Encounter for antenatal screening for uncertain dates: Secondary | ICD-10-CM

## 2015-09-16 DIAGNOSIS — Z36 Encounter for antenatal screening of mother: Secondary | ICD-10-CM

## 2015-09-16 DIAGNOSIS — Z113 Encounter for screening for infections with a predominantly sexual mode of transmission: Secondary | ICD-10-CM

## 2015-09-16 NOTE — Patient Instructions (Signed)
Minor Illnesses and Medications in Pregnancy  Cold/Flu:  Sudafed for congestion- Robitussin (plain) for cough- Tylenol for discomfort.  Please follow the directions on the label.  Try not to take any more than needed.  OTC Saline nasal spray and air humidifier or cool-mist  Vaporizer to sooth nasal irritation and to loosen congestion.  It is also important to increase intake of non carbonated fluids, especially if you have a fever.  Constipation:  Colace-2 capsules at bedtime; Metamucil- follow directions on label; Senokot- 1 tablet at bedtime.  Any one of these medications can be used.  It is also very important to increase fluids and fruits along with regular exercise.  If problem persists please call the office.  Diarrhea:  Kaopectate as directed on the label.  Eat a bland diet and increase fluids.  Avoid highly seasoned foods.  Headache:  Tylenol 1 or 2 tablets every 3-4 hours as needed  Indigestion:  Maalox, Mylanta, Tums or Rolaids- as directed on label.  Also try to eat small meals and avoid fatty, greasy or spicy foods.  Nausea with or without Vomiting:  Nausea in pregnancy is caused by increased levels of hormones in the body which influence the digestive system and cause irritation when stomach acids accumulate.  Symptoms usually subside after 1st trimester of pregnancy.  Try the following: 1. Keep saltines, graham crackers or dry toast by your bed to eat upon awakening. 2. Don't let your stomach get empty.  Try to eat 5-6 small meals per day instead of 3 large ones. 3. Avoid greasy fatty or highly seasoned foods.  4. Take OTC Unisom 1 tablet at bed time along with OTC Vitamin B6 25-50 mg 3 times per day.    If nausea continues with vomiting and you are unable to keep down food and fluids you may need a prescription medication.  Please notify your provider.   Sore throat:  Chloraseptic spray, throat lozenges and or plain Tylenol.  Vaginal Yeast Infection:  OTC Monistat for 7 days as  directed on label.  If symptoms do not resolve within a week notify provider.  If any of the above problems do not subside with recommended treatment please call the office for further assistance.   Do not take Aspirin, Advil, Motrin or Ibuprofen.  * * OTC= Over the counter Commonly Asked Questions During Pregnancy  Cats: A parasite can be excreted in cat feces.  To avoid exposure you need to have another person empty the little box.  If you must empty the litter box you will need to wear gloves.  Wash your hands after handling your cat.  This parasite can also be found in raw or undercooked meat so this should also be avoided.  Colds, Sore Throats, Flu: Please check your medication sheet to see what you can take for symptoms.  If your symptoms are unrelieved by these medications please call the office.  Dental Work: Most any dental work Investment banker, corporate recommends is permitted.  X-rays should only be taken during the first trimester if absolutely necessary.  Your abdomen should be shielded with a lead apron during all x-rays.  Please notify your provider prior to receiving any x-rays.  Novocaine is fine; gas is not recommended.  If your dentist requires a note from [place] prior to dental work please call the office and we will provide one for you.  Exercise: Exercise is an important part of staying healthy during your pregnancy.  You may continue most exercises you were  accustomed to prior to pregnancy.  Later in your pregnancy you will most likely notice you have difficulty with activities requiring balance like riding a bicycle.  It is important that you listen to your body and avoid activities that put you at a higher risk of falling.  Adequate rest and staying well hydrated are a must!  If you have questions about the safety of specific activities ask your provider.    Exposure to Children with illness: Try to avoid obvious exposure; report any symptoms to [place] when noted,  If you have chicken pos, red  measles or mumps, you should be immune to these diseases.   Please do not take any vaccines while pregnant unless you have checked with your OB provider.  Fetal Movement: After 28 weeks we recommend you do "kick counts" twice daily.  Lie or sit down in a calm quiet environment and count your baby movements "kicks".  You should feel your baby at least 10 times per hour.  If you have not felt 10 kicks within the first hour get up, walk around and have something sweet to eat or drink then repeat for an additional hour.  If count remains less than 10 per hour notify your provider.  Fumigating: Follow your pest control agent's advice as to how long to stay out of your home.  Ventilate the area well before re-entering.  Hemorrhoids:   Most over-the-counter preparations can be used during pregnancy.  Check your medication to see what is safe to use.  It is important to use a stool softener or fiber in your diet and to drink lots of liquids.  If hemorrhoids seem to be getting worse please call the office.   Hot Tubs:  Hot tubs Jacuzzis and saunas are not recommended while pregnant.  These increase your internal body temperature and should be avoided.  Intercourse:  Sexual intercourse is safe during pregnancy as long as you are comfortable, unless otherwise advised by your provider.  Spotting may occur after intercourse; report any bright red bleeding that is heavier than spotting.  Labor:  If you know that you are in labor, please go to the hospital.  If you are unsure, please call the office and let us help you decide what to do.  Lifting, straining, etc:  If your job requires heavy lifting or straining please check with your provider for any limitations.  Generally, you should not lift items heavier than that you can lift simply with your hands and arms (no back muscles)  Painting:  Paint fumes do not harm your pregnancy, but may make you ill and should be avoided if possible.  Latex or water based paints  have less odor than oils.  Use adequate ventilation while painting.  Permanents & Hair Color:  Chemicals in hair dyes are not recommended as they cause increase hair dryness which can increase hair loss during pregnancy.  " Highlighting" and permanents are allowed.  Dye may be absorbed differently and permanents may not hold as well during pregnancy.  Sunbathing:  Use a sunscreen, as skin burns easily during pregnancy.  Drink plenty of fluids; avoid over heating.  Tanning Beds:  Because their possible side effects are still unknown, tanning beds are not recommended.  Ultrasound Scans:  Routine ultrasounds are performed at approximately 20 weeks.  You will be able to see your baby's general anatomy an if you would like to know the gender this can usually be determined as well.  If it is questionable  when you conceived you may also receive an ultrasound early in your pregnancy for dating purposes.  Otherwise ultrasound exams are not routinely performed unless there is a medical necessity.  Although you can request a scan we ask that you pay for it when conducted because insurance does not cover " patient request" scans.  Work: If your pregnancy proceeds without complications you may work until your due date, unless your physician or employer advises otherwise.  Round Ligament Pain/Pelvic Discomfort:  Sharp, shooting pains not associated with bleeding are fairly common, usually occurring in the second trimester of pregnancy.  They tend to be worse when standing up or when you remain standing for long periods of time.  These are the result of pressure of certain pelvic ligaments called "round ligaments".  Rest, Tylenol and heat seem to be the most effective relief.  As the womb and fetus grow, they rise out of the pelvis and the discomfort improves.  Please notify the office if your pain seems different than that described.  It may represent a more serious condition.

## 2015-09-16 NOTE — Progress Notes (Signed)
[name] presents for NOB nurse interview visit. G-6.  P-[id number]. Pt had confirmation at [facility] but did not bring paper work. Noted in ER visit 08/27/2015 a positive pregnancy test. Pt had miscarriage at approx 11-12wks and miscarriage 14wks +. Pt thinks she may be further along. She states she feels movement on both sides. Ultrasound ordered for this afternoon for dating and viability. Pregnancy eduction material explained and given. No cats in the home. NOB labs ordered. TSH/HbgA1c due to Increased BMI, HIV labs and Drug screen were explained optional and she could opt out of tests but did not decline. Drug screen ordered. PNV encouraged. [facility].Flu vaccine given 05/11/2015.  Pt. To follow up with provider in 3 weeks for NOB physical.  All questions answered.  Pt was to come back at 3:45pm today for ultrasound for viability and dating but called and rescheduled. She did not make her NOB appt before leaving because she was going to do it when she came back today.     ZIKA EXPOSURE SCREEN:  The patient has not traveled to a [place] endemic area within the past 6 months, nor has she had unprotected sex with a partner who has travelled to a [place] endemic region within the past 6 months. The patient has been advised to notify us if these factors change any time during this current pregnancy, so adequate testing and monitoring can be initiated.

## 2015-09-17 LAB — HEMOGLOBIN A1C
Est. average glucose Bld gHb Est-mCnc: 97 mg/dL
Hgb A1c MFr Bld: 5 % (ref 4.8–5.6)

## 2015-09-17 LAB — ABO

## 2015-09-17 LAB — HIV ANTIBODY (ROUTINE TESTING W REFLEX): HIV SCREEN 4TH GENERATION: NONREACTIVE

## 2015-09-17 LAB — TSH: TSH: 1.01 u[IU]/mL (ref 0.450–4.500)

## 2015-09-17 LAB — URINALYSIS, ROUTINE W REFLEX MICROSCOPIC
BILIRUBIN UA: NEGATIVE
Glucose, UA: NEGATIVE
Ketones, UA: NEGATIVE
Nitrite, UA: NEGATIVE
PH UA: 8 — AB (ref 5.0–7.5)
RBC UA: NEGATIVE
Specific Gravity, UA: 1.024 (ref 1.005–1.030)
Urobilinogen, Ur: 1 mg/dL (ref 0.2–1.0)

## 2015-09-17 LAB — RUBELLA ANTIBODY, IGM

## 2015-09-17 LAB — GC/CHLAMYDIA PROBE AMP
Chlamydia trachomatis, NAA: NEGATIVE
Neisseria gonorrhoeae by PCR: NEGATIVE

## 2015-09-17 LAB — MICROSCOPIC EXAMINATION: Casts: NONE SEEN /lpf

## 2015-09-17 LAB — SICKLE CELL SCREEN: Sickle Cell Screen: NEGATIVE

## 2015-09-17 LAB — RH TYPE: Rh Factor: POSITIVE

## 2015-09-17 LAB — RPR: RPR: NONREACTIVE

## 2015-09-17 LAB — VARICELLA ZOSTER ANTIBODY, IGM: Varicella IgM: <0.91 index (ref 0.00–0.90)

## 2015-09-17 LAB — HEPATITIS B SURFACE ANTIGEN: Hepatitis B Surface Ag: NEGATIVE

## 2015-09-18 ENCOUNTER — Other Ambulatory Visit: Payer: Self-pay | Admitting: Certified Nurse Midwife

## 2015-09-18 DIAGNOSIS — Z3687 Encounter for antenatal screening for uncertain dates: Secondary | ICD-10-CM

## 2015-09-18 LAB — PAIN MGT SCRN (14 DRUGS), UR
AMPHETAMINE SCRN UR: NEGATIVE ng/mL
BARBITURATE SCRN UR: NEGATIVE ng/mL
BENZODIAZEPINE SCREEN, URINE: NEGATIVE ng/mL
Buprenorphine, Urine: NEGATIVE ng/mL
COCAINE(METAB.) SCREEN, URINE: NEGATIVE ng/mL
Cannabinoids Ur Ql Scn: NEGATIVE ng/mL
Creatinine(Crt), U: 153.4 mg/dL (ref 20.0–300.0)
Fentanyl, Urine: NEGATIVE pg/mL
METHADONE SCREEN, URINE: NEGATIVE ng/mL
Meperidine Screen, Urine: NEGATIVE ng/mL
Opiate Scrn, Ur: NEGATIVE ng/mL
Oxycodone+Oxymorphone Ur Ql Scn: NEGATIVE ng/mL
PCP Scrn, Ur: NEGATIVE ng/mL
PH UR, DRUG SCRN: 8.1 (ref 4.5–8.9)
Propoxyphene, Screen: NEGATIVE ng/mL
TRAMADOL UR QL SCN: NEGATIVE ng/mL

## 2015-09-18 LAB — URINE CULTURE, OB REFLEX

## 2015-09-18 LAB — NICOTINE SCREEN, URINE: Cotinine Ql Scrn, Ur: NEGATIVE ng/mL

## 2015-09-18 LAB — CULTURE, OB URINE

## 2015-09-21 ENCOUNTER — Encounter: Payer: Self-pay | Admitting: Certified Nurse Midwife

## 2015-09-21 DIAGNOSIS — O09899 Supervision of other high risk pregnancies, unspecified trimester: Secondary | ICD-10-CM | POA: Insufficient documentation

## 2015-09-21 DIAGNOSIS — O9989 Other specified diseases and conditions complicating pregnancy, childbirth and the puerperium: Secondary | ICD-10-CM

## 2015-09-21 DIAGNOSIS — Z2839 Other underimmunization status: Secondary | ICD-10-CM | POA: Insufficient documentation

## 2015-09-21 DIAGNOSIS — O09891 Supervision of other high risk pregnancies, first trimester: Secondary | ICD-10-CM | POA: Insufficient documentation

## 2015-09-21 DIAGNOSIS — Z283 Underimmunization status: Secondary | ICD-10-CM | POA: Insufficient documentation

## 2015-09-21 DIAGNOSIS — O09219 Supervision of pregnancy with history of pre-term labor, unspecified trimester: Secondary | ICD-10-CM

## 2015-09-22 ENCOUNTER — Ambulatory Visit (INDEPENDENT_AMBULATORY_CARE_PROVIDER_SITE_OTHER): Payer: Self-pay

## 2015-09-22 DIAGNOSIS — Z3687 Encounter for antenatal screening for uncertain dates: Secondary | ICD-10-CM

## 2015-09-22 DIAGNOSIS — Z36 Encounter for antenatal screening of mother: Secondary | ICD-10-CM

## 2015-09-22 MED ORDER — FLUCONAZOLE 100 MG PO TABS: 100.0000 mg | ORAL_TABLET | Freq: Every day | ORAL | Status: DC

## 2015-09-22 NOTE — Addendum Note (Signed)
Addended by: Senaida LangeVARNER, Lateia Fraser K on: 09/22/2015 10:17 AM   Modules accepted: Orders

## 2015-10-11 NOTE — L&D Delivery Note (Signed)
Delivery Note At  0211am a viable and healthy female was delivered via  (Presentation:OA ;  ).  APGAR:8 ,9 ; weight  .   Placenta status: delivered intact with 3 vessel Cord:  with the following complications: none  Anesthesia:  epidural Episiotomy:  none Lacerations:  none Suture Repair: NA Est. Blood Loss (mL):  250  Mom to postpartum.  Baby to Couplet care / Skin to Skin.  Melody NIKE, CNM 05/08/2016, 2:23 AM

## 2015-10-15 ENCOUNTER — Encounter: Payer: Self-pay | Admitting: Obstetrics and Gynecology

## 2015-10-27 ENCOUNTER — Encounter: Payer: Self-pay | Admitting: Certified Nurse Midwife

## 2015-10-29 ENCOUNTER — Encounter: Payer: Self-pay | Admitting: Obstetrics and Gynecology

## 2015-10-29 ENCOUNTER — Ambulatory Visit (INDEPENDENT_AMBULATORY_CARE_PROVIDER_SITE_OTHER): Payer: Self-pay | Admitting: Obstetrics and Gynecology

## 2015-10-29 VITALS — BP 112/76 | HR 68 | Wt 144.6 lb

## 2015-10-29 DIAGNOSIS — Z803 Family history of malignant neoplasm of breast: Secondary | ICD-10-CM | POA: Insufficient documentation

## 2015-10-29 DIAGNOSIS — O09213 Supervision of pregnancy with history of pre-term labor, third trimester: Secondary | ICD-10-CM

## 2015-10-29 DIAGNOSIS — O9989 Other specified diseases and conditions complicating pregnancy, childbirth and the puerperium: Secondary | ICD-10-CM

## 2015-10-29 DIAGNOSIS — Z331 Pregnant state, incidental: Secondary | ICD-10-CM

## 2015-10-29 DIAGNOSIS — Z2839 Other underimmunization status: Secondary | ICD-10-CM

## 2015-10-29 DIAGNOSIS — O09891 Supervision of other high risk pregnancies, first trimester: Secondary | ICD-10-CM

## 2015-10-29 DIAGNOSIS — O09893 Supervision of other high risk pregnancies, third trimester: Secondary | ICD-10-CM

## 2015-10-29 DIAGNOSIS — Z283 Underimmunization status: Secondary | ICD-10-CM

## 2015-10-29 LAB — POCT URINALYSIS DIPSTICK
Bilirubin, UA: NEGATIVE
Glucose, UA: NEGATIVE
Ketones, UA: NEGATIVE
Leukocytes, UA: NEGATIVE
NITRITE UA: NEGATIVE
PH UA: 7
RBC UA: NEGATIVE
Spec Grav, UA: 1.01
UROBILINOGEN UA: 0.2

## 2015-10-29 NOTE — Progress Notes (Signed)
NEW OB HISTORY AND PHYSICAL  SUBJECTIVE:       Hayley Kirk is a 30 y.o. [phone] female, Patient's last menstrual period was 08/07/2015., Estimated Date of Delivery: 05/13/16, [redacted]w[redacted]d, presents today for establishment of Prenatal Care. She has no unusual complaints and complains of none      Gynecologic History Patient's last menstrual period was 08/07/2015. Normal Contraception: none Last Pap: 2016. Results were: normal  Obstetric History OB History  Gravida Para Term Preterm AB SAB TAB Ectopic Multiple Living  0 0 3    # Outcome Date GA Lbr Len/2nd Weight Sex Delivery Anes PTL Lv  6 Current           5 TAB 02/2015 [redacted]w[redacted]d       FD  4 SAB 2014 [redacted]w[redacted]d       FD  3 Term 03/18/12 [redacted]w[redacted]d  6 lb (2.722 kg) M Vag-Spont   Y  2 Preterm 06/09/09 [redacted]w[redacted]d  5 lb (2.268 kg) M Vag-Spont  Y Y  1 Term 04/19/08 [redacted]w[redacted]d  7 lb (3.175 kg) F Vag-Spont  N Y    Obstetric Comments  Pt told after miscarriage that she could not have any more children "punch a hole in cervix?"    Past Medical History  Diagnosis Date  . Anxiety   . Headache     hx of migraines    Past Surgical History  Procedure Laterality Date  . Dilation and curettage of uterus      Current Outpatient Prescriptions on File Prior to Visit  Medication Sig Dispense Refill  . Prenatal Vit-Fe Fumarate-FA (PRENATAL MULTIVITAMIN) TABS tablet Take 1 tablet by mouth daily at 12 noon.    Marland Kitchen acetaminophen-codeine (TYLENOL #3) 300-30 MG tablet Take 1 tablet by mouth every 6 (six) hours as needed for moderate pain. (Patient not taking: Reported on 09/16/2015) 10 tablet 0  . benzonatate (TESSALON PERLES) 100 MG capsule Take 1 capsule (100 mg total) by mouth 3 (three) times daily as needed for cough (Take 1-2 per dose). (Patient not taking: Reported on 09/16/2015) 30 capsule 0  . fluconazole (DIFLUCAN) 100 MG tablet Take 1 tablet (100 mg total) by mouth daily. (Patient not taking: Reported on 10/29/2015) 5 tablet 0  . fluticasone (FLONASE) 50  MCG/ACT nasal spray Place 1 spray into both nostrils daily. (Patient not taking: Reported on 09/16/2015) 16 g 0  . ibuprofen (ADVIL,MOTRIN) 800 MG tablet Take 1 tablet (800 mg total) by mouth every 8 (eight) hours as needed. (Patient not taking: Reported on 09/16/2015) 30 tablet 0   No current facility-administered medications on file prior to visit.    No Known Allergies  Social History   Social History  . Marital Status: Single    Spouse Name: N/A  . Number of Children: N/A  . Years of Education: N/A   Occupational History  . sales     serves ice cream   Social History Main Topics  . Smoking status: Never Smoker   . Smokeless tobacco: Never Used  . Alcohol Use: No  . Drug Use: No  . Sexual Activity:    Partners: Male   Other Topics Concern  . Not on file   Social History Narrative    Family History  Problem Relation Age of Onset  . Breast cancer Mother     stage 4  . Stomach cancer Mother   . Breast cancer Maternal Aunt     x2  . Stomach  cancer Maternal Aunt     passed away 35yrs ago  . Diabetes Mother   . Heart failure Father   . Hyperlipidemia Mother   . Hypertension Mother   . Migraines Sister   . Stroke Mother     The following portions of the patient's history were reviewed and updated as appropriate: allergies, current medications, past OB history, past medical history, past surgical history, past family history, past social history, and problem list.    OBJECTIVE: Initial Physical Exam (New OB)  GENERAL APPEARANCE: alert, well appearing, in no apparent distress, oriented to person, place and time HEAD: normocephalic, atraumatic MOUTH: mucous membranes moist, pharynx normal without lesions THYROID: no thyromegaly or masses present and enlarged BREASTS: not examined LUNGS: clear to auscultation, no wheezes, rales or rhonchi, symmetric air entry HEART: regular rate and rhythm, no murmurs ABDOMEN: soft, nontender, nondistended, no abnormal masses, no  epigastric pain, fundus not palpable and FHT present EXTREMITIES: no redness or tenderness in the calves or thighs SKIN: normal coloration and turgor, no rashes LYMPH NODES: no adenopathy palpable NEUROLOGIC: alert, oriented, normal speech, no focal findings or movement disorder noted  PELVIC EXAM not indicated  ASSESSMENT: Normal pregnancy Strong family h/o breast cancer H/o PTD at 33 weeks secondary to CMV  PLAN: Prenatal care Desires genetic screening Counseled on myrisk- will gather more information See orders

## 2015-10-29 NOTE — Progress Notes (Signed)
NOB- pt is c/o nausea

## 2015-10-29 NOTE — Patient Instructions (Signed)

## 2015-11-09 ENCOUNTER — Encounter: Payer: Self-pay | Admitting: Obstetrics and Gynecology

## 2015-11-10 ENCOUNTER — Other Ambulatory Visit: Payer: Self-pay | Admitting: Obstetrics and Gynecology

## 2015-11-11 ENCOUNTER — Telehealth: Payer: Self-pay | Admitting: *Deleted

## 2015-11-11 NOTE — Telephone Encounter (Signed)
Notified pt of results 

## 2015-11-11 NOTE — Telephone Encounter (Signed)
-----   Message from Purcell Nails, [place] sent at 11/10/2015  5:07 PM EST ----- Please mail note with panarama rsults- or try to contact patient tomorrow, Low risks female, tried to call but no answer and doesn't have VM set up.

## 2015-11-23 ENCOUNTER — Encounter: Payer: Self-pay | Admitting: Obstetrics and Gynecology

## 2015-11-26 ENCOUNTER — Encounter: Payer: Medicaid Other | Admitting: Obstetrics and Gynecology

## 2015-11-27 ENCOUNTER — Ambulatory Visit (INDEPENDENT_AMBULATORY_CARE_PROVIDER_SITE_OTHER): Payer: Medicaid Other | Admitting: Obstetrics and Gynecology

## 2015-11-27 VITALS — BP 120/65 | HR 68 | Wt 150.4 lb

## 2015-11-27 DIAGNOSIS — Z3482 Encounter for supervision of other normal pregnancy, second trimester: Secondary | ICD-10-CM

## 2015-11-27 DIAGNOSIS — Z3492 Encounter for supervision of normal pregnancy, unspecified, second trimester: Secondary | ICD-10-CM

## 2015-11-27 LAB — POCT URINALYSIS DIPSTICK
BILIRUBIN UA: NEGATIVE
Blood, UA: NEGATIVE
GLUCOSE UA: NEGATIVE
KETONES UA: NEGATIVE
Nitrite, UA: NEGATIVE
Protein, UA: NEGATIVE
Spec Grav, UA: >=1.03
Urobilinogen, UA: NEGATIVE
pH, UA: 6

## 2015-11-27 NOTE — Progress Notes (Signed)
ROB- doing well, concerned by weight. anatomy scan next visit

## 2015-12-23 ENCOUNTER — Encounter: Payer: Self-pay | Admitting: Obstetrics and Gynecology

## 2015-12-23 ENCOUNTER — Ambulatory Visit (INDEPENDENT_AMBULATORY_CARE_PROVIDER_SITE_OTHER): Payer: Medicaid Other | Admitting: Obstetrics and Gynecology

## 2015-12-23 ENCOUNTER — Ambulatory Visit (INDEPENDENT_AMBULATORY_CARE_PROVIDER_SITE_OTHER): Payer: Medicaid Other

## 2015-12-23 VITALS — BP 106/67 | HR 59 | Wt 151.9 lb

## 2015-12-23 DIAGNOSIS — Z3492 Encounter for supervision of normal pregnancy, unspecified, second trimester: Secondary | ICD-10-CM

## 2015-12-23 DIAGNOSIS — Z3482 Encounter for supervision of other normal pregnancy, second trimester: Secondary | ICD-10-CM | POA: Diagnosis not present

## 2015-12-23 DIAGNOSIS — Z331 Pregnant state, incidental: Secondary | ICD-10-CM

## 2015-12-23 DIAGNOSIS — O4442 Low lying placenta NOS or without hemorrhage, second trimester: Secondary | ICD-10-CM

## 2015-12-23 LAB — POCT URINALYSIS DIPSTICK
BILIRUBIN UA: NEGATIVE
GLUCOSE UA: NEGATIVE
Ketones, UA: NEGATIVE
NITRITE UA: NEGATIVE
Protein, UA: NEGATIVE
RBC UA: NEGATIVE
Spec Grav, UA: 1.01
UROBILINOGEN UA: 0.2
pH, UA: 6.5

## 2015-12-23 NOTE — Progress Notes (Signed)
ROB- anatomy scan done today, c/o constipation

## 2015-12-23 NOTE — Progress Notes (Signed)
dications:  Anatomy Findings:  Singleton intrauterine pregnancy is visualized with FHR at 149 BPM. Biometrics give an (U/S) Gestational age of [redacted] weeks 2 days,  and an (U/S) EDD of 05/09/16; this correlates with the clinically established EDD of 05/13/16.  Fetal presentation is Vertex, spine variable.  EFW: 338 grams ( 0 lbs. 12 oz. ). Placenta: Posterior, low lying at 1.1 cm from cervical os and grade 0. AFI: Adequate with a MVP of 5.7 cm.  Anatomic survey is complete and appears WNL. Gender - Female.   Right and left ovaries were not visualized. There is no evidence of a corpus luteal cyst. Survey of the adnexa demonstrates no adnexal masses. There is no free peritoneal fluid in the cul de sac.  Impression: 1. 20 week 2 day Viable Singleton Intrauterine pregnancy by U/S. 2. (U/S) EDD is consistent with Clinically established (LMP) EDD of 05/13/16. 3. Low Lying posterior placenta at 1.1 cm from cervical os.  4. Normal appearing anatomy scan.  Recommendations: 1.Clinical correlation with the patient's History and Physical Exam. 2. Have patient return between 28-30 weeks for reassessment of low lying placenta.   Elliott,Teresa, [employer]  Scan reviewed and agree with findings, discussed with patient at visit; will schedule f/u u/s in 8 weeks.

## 2016-01-20 ENCOUNTER — Encounter: Payer: Medicaid Other | Admitting: Obstetrics and Gynecology

## 2016-01-20 ENCOUNTER — Encounter: Payer: Self-pay | Admitting: Obstetrics and Gynecology

## 2016-01-20 ENCOUNTER — Ambulatory Visit (INDEPENDENT_AMBULATORY_CARE_PROVIDER_SITE_OTHER): Payer: Medicaid Other | Admitting: Obstetrics and Gynecology

## 2016-01-20 VITALS — BP 104/67 | HR 63 | Wt 156.9 lb

## 2016-01-20 DIAGNOSIS — Z331 Pregnant state, incidental: Secondary | ICD-10-CM

## 2016-01-20 LAB — POCT URINALYSIS DIPSTICK
BILIRUBIN UA: NEGATIVE
GLUCOSE UA: NEGATIVE
Ketones, UA: NEGATIVE
Nitrite, UA: NEGATIVE
Protein, UA: NEGATIVE
RBC UA: NEGATIVE
SPEC GRAV UA: 1.01
Urobilinogen, UA: 0.2
pH, UA: 7

## 2016-01-20 NOTE — Progress Notes (Signed)
ROB- doing well, some pelvic pressure, urine sent for culture.

## 2016-01-20 NOTE — Progress Notes (Signed)
ROB- pt is c/o pelvic pressure, some braxton hicks

## 2016-01-21 LAB — URINE CULTURE

## 2016-01-25 ENCOUNTER — Telehealth: Payer: Self-pay | Admitting: *Deleted

## 2016-01-25 NOTE — Telephone Encounter (Signed)
Spoke with pt

## 2016-01-25 NOTE — Telephone Encounter (Signed)
Patient called and was inquiring about her lab results. Patient is requesting a call back . Cal back number [phone]

## 2016-02-12 ENCOUNTER — Other Ambulatory Visit: Payer: Self-pay | Admitting: Obstetrics and Gynecology

## 2016-02-12 DIAGNOSIS — O4442 Low lying placenta NOS or without hemorrhage, second trimester: Secondary | ICD-10-CM

## 2016-02-15 ENCOUNTER — Other Ambulatory Visit: Payer: Self-pay | Admitting: *Deleted

## 2016-02-15 DIAGNOSIS — Z131 Encounter for screening for diabetes mellitus: Secondary | ICD-10-CM

## 2016-02-15 DIAGNOSIS — Z331 Pregnant state, incidental: Secondary | ICD-10-CM

## 2016-02-18 ENCOUNTER — Ambulatory Visit (INDEPENDENT_AMBULATORY_CARE_PROVIDER_SITE_OTHER): Payer: Medicaid Other

## 2016-02-18 ENCOUNTER — Ambulatory Visit (INDEPENDENT_AMBULATORY_CARE_PROVIDER_SITE_OTHER): Payer: Medicaid Other | Admitting: Obstetrics and Gynecology

## 2016-02-18 ENCOUNTER — Encounter: Payer: Self-pay | Admitting: Obstetrics and Gynecology

## 2016-02-18 ENCOUNTER — Other Ambulatory Visit: Payer: Medicaid Other

## 2016-02-18 VITALS — BP 99/71 | HR 65 | Wt 162.1 lb

## 2016-02-18 DIAGNOSIS — Z23 Encounter for immunization: Secondary | ICD-10-CM | POA: Diagnosis not present

## 2016-02-18 DIAGNOSIS — Z131 Encounter for screening for diabetes mellitus: Secondary | ICD-10-CM

## 2016-02-18 DIAGNOSIS — O4442 Low lying placenta NOS or without hemorrhage, second trimester: Secondary | ICD-10-CM

## 2016-02-18 DIAGNOSIS — Z331 Pregnant state, incidental: Secondary | ICD-10-CM

## 2016-02-18 LAB — POCT URINALYSIS DIPSTICK
Bilirubin, UA: NEGATIVE
Glucose, UA: NEGATIVE
Ketones, UA: NEGATIVE
NITRITE UA: NEGATIVE
PH UA: 6
Protein, UA: NEGATIVE
RBC UA: NEGATIVE
Spec Grav, UA: 1.01
UROBILINOGEN UA: 0.2

## 2016-02-18 MED ORDER — TETANUS-DIPHTH-ACELL PERTUSSIS 5-2.5-18.5 LF-MCG/0.5 IM SUSP
0.5000 mL | Freq: Once | INTRAMUSCULAR | Status: AC
  Administered 2016-02-18: 0.5 mL via INTRAMUSCULAR

## 2016-02-18 NOTE — Progress Notes (Signed)
ROB- glucola done, blood consent signed, tdap given ROB- pt is having a lot pelvic pressure, some contractions x 1 day

## 2016-02-18 NOTE — Progress Notes (Signed)
ROB- doing well, denies concerns, does reports occasional BHC ; no questions about Tdap or blood consent, reviewed prev. Deliveries at 37 & 33 weeks, will start pelvic exams at 34 weeks.  Reviewed u/s findings today:  Indications:Placenta Location Findings:  Singleton intrauterine pregnancy is visualized with FHR at 137 BPM.   Fetal presentation is Vertex. Placenta: Posterior, grade1, 4cm from internal os. AFI: 17.9cm  Survey of the adnexa demonstrates no adnexal masses. There is no free peritoneal fluid in the cul de sac.  Impression: 1. Posterior Placenta 4cm from internal os.

## 2016-02-18 NOTE — Patient Instructions (Signed)

## 2016-02-19 LAB — HEMOGLOBIN AND HEMATOCRIT, BLOOD
HEMOGLOBIN: 11.9 g/dL (ref 11.1–15.9)
Hematocrit: 35.3 % (ref 34.0–46.6)

## 2016-02-19 LAB — GLUCOSE, 1 HOUR GESTATIONAL: Gestational Diabetes Screen: 121 mg/dL (ref 65–139)

## 2016-02-19 NOTE — Telephone Encounter (Signed)
Notified pt of results 

## 2016-02-19 NOTE — Telephone Encounter (Signed)
-----   Message from Purcell NailsMelody N Shambley, [place] sent at 02/19/2016  3:32 PM EDT ----- Please let her know she passed her glucola and no signs of anemia

## 2016-03-03 ENCOUNTER — Ambulatory Visit (INDEPENDENT_AMBULATORY_CARE_PROVIDER_SITE_OTHER): Payer: Medicaid Other | Admitting: Obstetrics and Gynecology

## 2016-03-03 VITALS — BP 106/70 | HR 76 | Wt 164.9 lb

## 2016-03-03 DIAGNOSIS — Z3493 Encounter for supervision of normal pregnancy, unspecified, third trimester: Secondary | ICD-10-CM

## 2016-03-03 LAB — POCT URINALYSIS DIPSTICK
BILIRUBIN UA: NEGATIVE
Blood, UA: NEGATIVE
Glucose, UA: NEGATIVE
KETONES UA: NEGATIVE
Nitrite, UA: NEGATIVE
Protein, UA: NEGATIVE
SPEC GRAV UA: 1.01
Urobilinogen, UA: 0.2
pH, UA: 7

## 2016-03-03 NOTE — Progress Notes (Signed)
Fetus active. Occasional contractions noted. No increase in vaginal discharge; no vaginal spotting. Patient is not on 17 OHP for history of preterm delivery. Premature labor precautions were given. Return in 2 weeks for follow-up.

## 2016-03-03 NOTE — Progress Notes (Signed)
ROB- legs hurting at times.

## 2016-03-17 ENCOUNTER — Encounter: Payer: Self-pay | Admitting: Obstetrics and Gynecology

## 2016-03-17 ENCOUNTER — Ambulatory Visit (INDEPENDENT_AMBULATORY_CARE_PROVIDER_SITE_OTHER): Payer: Medicaid Other | Admitting: Obstetrics and Gynecology

## 2016-03-17 VITALS — BP 96/63 | HR 63 | Wt 166.4 lb

## 2016-03-17 DIAGNOSIS — Z369 Encounter for antenatal screening, unspecified: Secondary | ICD-10-CM

## 2016-03-17 DIAGNOSIS — Z1389 Encounter for screening for other disorder: Secondary | ICD-10-CM

## 2016-03-17 DIAGNOSIS — Z36 Encounter for antenatal screening of mother: Secondary | ICD-10-CM

## 2016-03-17 LAB — POCT URINALYSIS DIPSTICK
BILIRUBIN UA: NEGATIVE
Blood, UA: NEGATIVE
Glucose, UA: NEGATIVE
KETONES UA: NEGATIVE
NITRITE UA: NEGATIVE
PH UA: 6.5
Spec Grav, UA: 1.015
Urobilinogen, UA: NEGATIVE

## 2016-03-17 NOTE — Progress Notes (Signed)
Rob-reports upper leg cramps - to add magOx nightly

## 2016-03-31 ENCOUNTER — Ambulatory Visit (INDEPENDENT_AMBULATORY_CARE_PROVIDER_SITE_OTHER): Payer: Medicaid Other | Admitting: Obstetrics and Gynecology

## 2016-03-31 ENCOUNTER — Encounter: Payer: Self-pay | Admitting: Obstetrics and Gynecology

## 2016-03-31 VITALS — BP 117/78 | HR 94 | Wt 168.4 lb

## 2016-03-31 DIAGNOSIS — Z3493 Encounter for supervision of normal pregnancy, unspecified, third trimester: Secondary | ICD-10-CM

## 2016-03-31 LAB — POCT URINALYSIS DIPSTICK
Bilirubin, UA: NEGATIVE
GLUCOSE UA: NEGATIVE
Ketones, UA: NEGATIVE
NITRITE UA: NEGATIVE
RBC UA: NEGATIVE
Spec Grav, UA: 1.01
UROBILINOGEN UA: 0.2
pH, UA: 6.5

## 2016-03-31 NOTE — Progress Notes (Signed)
ROB- reports increased vaginal itching with normal discharge  Microscopic wet-mount exam shows negative for pathogens, normal epithelial cells. Reassured patient, cultures next visit.

## 2016-03-31 NOTE — Progress Notes (Signed)
ROB-pt is c/o vaginal itching-inside, just doesn't feel good

## 2016-04-02 LAB — URINE CULTURE

## 2016-04-04 ENCOUNTER — Telehealth: Payer: Self-pay

## 2016-04-04 NOTE — Telephone Encounter (Signed)
Ob call transferred from front desk- pt wanted to know about her u/c- advised it was neg. Pt states she is having a lot of pressure. NO uto sx. NO lof or vb. Pos fm. Frequent ctx that last about 2 minutes. Comes and goes. Advise pt to stay hydrated. Tylenol es as directed. Lay on left side. If ctx increase, lof or vb pt will need to be seen asap.

## 2016-04-14 ENCOUNTER — Encounter: Payer: Self-pay | Admitting: Obstetrics and Gynecology

## 2016-04-14 ENCOUNTER — Ambulatory Visit (INDEPENDENT_AMBULATORY_CARE_PROVIDER_SITE_OTHER): Payer: Medicaid Other | Admitting: Obstetrics and Gynecology

## 2016-04-14 VITALS — BP 103/64 | HR 64 | Wt 169.3 lb

## 2016-04-14 DIAGNOSIS — Z3685 Encounter for antenatal screening for Streptococcus B: Secondary | ICD-10-CM

## 2016-04-14 DIAGNOSIS — Z3493 Encounter for supervision of normal pregnancy, unspecified, third trimester: Secondary | ICD-10-CM

## 2016-04-14 DIAGNOSIS — Z113 Encounter for screening for infections with a predominantly sexual mode of transmission: Secondary | ICD-10-CM

## 2016-04-14 DIAGNOSIS — Z36 Encounter for antenatal screening of mother: Secondary | ICD-10-CM

## 2016-04-14 LAB — POCT URINALYSIS DIPSTICK
Bilirubin, UA: NEGATIVE
Glucose, UA: NEGATIVE
KETONES UA: NEGATIVE
LEUKOCYTES UA: NEGATIVE
NITRITE UA: NEGATIVE
PH UA: 6.5
PROTEIN UA: NEGATIVE
RBC UA: NEGATIVE
Spec Grav, UA: 1.01
Urobilinogen, UA: 0.2

## 2016-04-14 NOTE — Patient Instructions (Signed)

## 2016-04-14 NOTE — Progress Notes (Signed)
ROB- cultures obtained, pt is c/o lots of pelvic pressure

## 2016-04-14 NOTE — Progress Notes (Signed)
ROB- cultures obtained, labor precautions discussed, plans depo PP.

## 2016-04-16 LAB — GC/CHLAMYDIA PROBE AMP
CHLAMYDIA, DNA PROBE: NEGATIVE
NEISSERIA GONORRHOEAE BY PCR: NEGATIVE

## 2016-04-16 LAB — STREP GP B NAA: Strep Gp B NAA: NEGATIVE

## 2016-04-21 ENCOUNTER — Ambulatory Visit (INDEPENDENT_AMBULATORY_CARE_PROVIDER_SITE_OTHER): Payer: Medicaid Other | Admitting: Obstetrics and Gynecology

## 2016-04-21 ENCOUNTER — Encounter: Payer: Self-pay | Admitting: Obstetrics and Gynecology

## 2016-04-21 VITALS — BP 95/62 | HR 76 | Wt 171.0 lb

## 2016-04-21 DIAGNOSIS — Z113 Encounter for screening for infections with a predominantly sexual mode of transmission: Secondary | ICD-10-CM

## 2016-04-21 DIAGNOSIS — Z36 Encounter for antenatal screening of mother: Secondary | ICD-10-CM

## 2016-04-21 DIAGNOSIS — Z3493 Encounter for supervision of normal pregnancy, unspecified, third trimester: Secondary | ICD-10-CM

## 2016-04-21 DIAGNOSIS — Z3685 Encounter for antenatal screening for Streptococcus B: Secondary | ICD-10-CM

## 2016-04-21 LAB — POCT URINALYSIS DIPSTICK
Bilirubin, UA: NEGATIVE
Blood, UA: NEGATIVE
Glucose, UA: NEGATIVE
KETONES UA: NEGATIVE
Nitrite, UA: NEGATIVE
PH UA: 6
PROTEIN UA: NEGATIVE
SPEC GRAV UA: 1.01
UROBILINOGEN UA: NEGATIVE

## 2016-04-21 NOTE — Progress Notes (Signed)
Rob- no complaints.  

## 2016-04-21 NOTE — Addendum Note (Signed)
Addended by: Marchelle FolksMILLER, Ericson Nafziger G on: 04/21/2016 02:56 PM   Modules accepted: Orders

## 2016-04-29 ENCOUNTER — Encounter: Payer: Self-pay | Admitting: Obstetrics and Gynecology

## 2016-04-29 ENCOUNTER — Ambulatory Visit (INDEPENDENT_AMBULATORY_CARE_PROVIDER_SITE_OTHER): Payer: Medicaid Other | Admitting: Obstetrics and Gynecology

## 2016-04-29 VITALS — BP 124/71 | HR 86 | Wt 171.7 lb

## 2016-04-29 DIAGNOSIS — Z3493 Encounter for supervision of normal pregnancy, unspecified, third trimester: Secondary | ICD-10-CM

## 2016-04-29 LAB — POCT URINALYSIS DIPSTICK
Bilirubin, UA: NEGATIVE
Blood, UA: NEGATIVE
GLUCOSE UA: NEGATIVE
Ketones, UA: NEGATIVE
NITRITE UA: NEGATIVE
Protein, UA: NEGATIVE
UROBILINOGEN UA: 0.2
pH, UA: 6

## 2016-04-29 NOTE — Progress Notes (Signed)
ROB- pt is having some pelvic pressure, low back pain 

## 2016-04-29 NOTE — Progress Notes (Signed)
ROB- doing well, labor precautions discussed.  

## 2016-05-01 LAB — URINE CULTURE: ORGANISM ID, BACTERIA: NO GROWTH

## 2016-05-03 ENCOUNTER — Telehealth: Payer: Self-pay | Admitting: Obstetrics and Gynecology

## 2016-05-03 NOTE — Telephone Encounter (Signed)
Pt called and she is constipated and would like a call back on what she can take to help relieve her.

## 2016-05-03 NOTE — Telephone Encounter (Signed)
Left detailed message for pt as what to try

## 2016-05-04 ENCOUNTER — Inpatient Hospital Stay
Admission: EM | Admit: 2016-05-04 | Discharge: 2016-05-04 | Disposition: A | Discharge status: Home / Self Care | Payer: Medicaid Other | Attending: Obstetrics and Gynecology | Admitting: Obstetrics and Gynecology

## 2016-05-04 ENCOUNTER — Encounter: Payer: Self-pay | Admitting: *Deleted

## 2016-05-04 DIAGNOSIS — O09891 Supervision of other high risk pregnancies, first trimester: Secondary | ICD-10-CM

## 2016-05-04 DIAGNOSIS — Z2839 Other underimmunization status: Secondary | ICD-10-CM

## 2016-05-04 DIAGNOSIS — Z283 Underimmunization status: Secondary | ICD-10-CM

## 2016-05-04 DIAGNOSIS — O09899 Supervision of other high risk pregnancies, unspecified trimester: Secondary | ICD-10-CM

## 2016-05-04 DIAGNOSIS — O9989 Other specified diseases and conditions complicating pregnancy, childbirth and the puerperium: Principal | ICD-10-CM

## 2016-05-04 NOTE — OB Triage Note (Signed)
Rcvd pt from ED. C/o of contractions but states that they are not in a regular pattern. Pt also c/o right sided back pain and a cough for 4 days, but states she has been around her mom that has had a cold. Pt has had sexual intercourse in the past 24 hours and is feeling baby move ok. Denies vaginal bleeding.

## 2016-05-04 NOTE — Discharge Instructions (Signed)
Come back if: ° °Big gush of fluid °Temp over 100.4 °Heavy vaginal bleeding °Decreased fetal movement °Contractions every 3-5 min lasting at least one hour ° °Get plenty of rest and stay well hydrated! °

## 2016-05-04 NOTE — OB Triage Provider Note (Signed)
Attestation:  I have reviewed the record and concur with patient management and plan.   Hildred Laser, MD [facility]     L&D OB Triage Note  Hayley Kirk is a 30 y.o. [id number] female at [redacted]w[redacted]d, EDD Estimated Date of Delivery: 05/13/16 who presented to triage for complaints of irregular contractions since yesterday with significant low back pain.  Denies decreased fetal mov't  She was evaluated by the nurses with no significant findings for active labor. As was having irregular contractions every 11-20 minutes. Ambulated for 30 minutes. Vital signs stable. An NST was performed and has been reviewed by Myself. She was treated with po hydration and reassured. Cervix unchanged at 2-3/60/-2 posterior.  NST INTERPRETATION: Indications: rule out uterine contractions  Mode: External Baseline Rate (A): 145 bpm Variability: Moderate Accelerations: 15 x 15 Decelerations: None     Contraction Frequency (min): rare  Impression: reactive   Plan: NST performed was reviewed and was found to be reactive. She was discharged home with bleeding/labor precautions.  Continue routine prenatal care. Follow up with OB/GYN as previously scheduled.     Melody Suzan Nailer, CNM

## 2016-05-06 ENCOUNTER — Ambulatory Visit (INDEPENDENT_AMBULATORY_CARE_PROVIDER_SITE_OTHER): Payer: Medicaid Other | Admitting: Obstetrics and Gynecology

## 2016-05-06 VITALS — BP 114/86 | HR 88 | Wt 173.1 lb

## 2016-05-06 DIAGNOSIS — Z3493 Encounter for supervision of normal pregnancy, unspecified, third trimester: Secondary | ICD-10-CM

## 2016-05-06 LAB — POCT URINALYSIS DIPSTICK
Bilirubin, UA: NEGATIVE
Blood, UA: NEGATIVE
Glucose, UA: NEGATIVE
Ketones, UA: NEGATIVE
Nitrite, UA: NEGATIVE
PH UA: 6
Spec Grav, UA: 1.01
UROBILINOGEN UA: 0.2

## 2016-05-06 NOTE — H&P (Signed)
Hayley Kirk is a 30 y.o. female presenting for labor induction-elective. OB History    Gravida Para Term Preterm AB Living   '6 3 2 1 2 3   '$ SAB TAB Ectopic Multiple Live Births   1 1 0 0 3      Obstetric Comments   Pt told after miscarriage that she could not have any more children "punch a hole in cervix?"     Past Medical History:  Diagnosis Date  . Headache    hx of migraines   Past Surgical History:  Procedure Laterality Date  . DILATION AND CURETTAGE OF UTERUS     Family History: family history includes Breast cancer in her maternal aunt and mother; Diabetes in her mother; Heart failure in her father; Hyperlipidemia in her mother; Hypertension in her mother; Migraines in her sister; Stomach cancer in her maternal aunt and mother; Stroke in her mother. Social History:  reports that she has never smoked. She has never used smokeless tobacco. She reports that she does not drink alcohol or use drugs.     Maternal Diabetes: No Genetic Screening: Normal Maternal Ultrasounds/Referrals: Normal Fetal Ultrasounds or other Referrals:  None Maternal Substance Abuse:  No Significant Maternal Medications:  None Significant Maternal Lab Results:  None Other Comments:  None  ROS History Dilation: 3.5 Effacement (%): 70 Station: -2 Blood pressure 114/86, pulse 88, weight 173 lb 1.6 oz (78.5 kg), last menstrual period 08/07/2015, unknown if currently breastfeeding. Exam Physical Exam  A&O x4  well groomed female in no distress  HRR Lungs clear Abdomen soft, gravid, with mild palpable contractions Prenatal labs: ABO, Rh: A/Positive/-- (12/07 1446) Antibody:   Rubella: <20.0 (12/07 1446) RPR: Non Reactive (12/07 1446)  HBsAg: Negative (12/07 1446)  HIV: Non Reactive (12/07 1446)  GBS: Negative (07/06 1219)   Assessment/Plan: Elective IOL at term- pitocin per protocol   Melody N Shambley 05/06/2016, 2:23 PM

## 2016-05-06 NOTE — Progress Notes (Signed)
ROB- pt is having a lot of pelvic pressure, low back pain 

## 2016-05-06 NOTE — Progress Notes (Signed)
ROB- labor precautions discussed, stripped membranes- will plan IOL on 8/1 if not delivered by then.

## 2016-05-06 NOTE — H&P (Deleted)
  The note originally documented on this encounter has been moved the the encounter in which it belongs.  

## 2016-05-07 ENCOUNTER — Inpatient Hospital Stay: Payer: Medicaid Other | Admitting: Anesthesiology

## 2016-05-07 ENCOUNTER — Inpatient Hospital Stay
Admission: AD | Admit: 2016-05-07 | Discharge: 2016-05-09 | DRG: 775 | Disposition: A | Discharge status: Home / Self Care | Payer: Medicaid Other | Source: Ambulatory Visit | Attending: Obstetrics and Gynecology | Admitting: Obstetrics and Gynecology

## 2016-05-07 DIAGNOSIS — Z3493 Encounter for supervision of normal pregnancy, unspecified, third trimester: Secondary | ICD-10-CM | POA: Diagnosis not present

## 2016-05-07 DIAGNOSIS — Z3A39 39 weeks gestation of pregnancy: Secondary | ICD-10-CM

## 2016-05-07 LAB — CBC
HCT: 35.9 % (ref 35.0–47.0)
Hemoglobin: 12.4 g/dL (ref 12.0–16.0)
MCH: 33.3 pg (ref 26.0–34.0)
MCHC: 34.5 g/dL (ref 32.0–36.0)
MCV: 96.6 fL (ref 80.0–100.0)
PLATELETS: 115 10*3/uL — AB (ref 150–440)
RBC: 3.72 MIL/uL — AB (ref 3.80–5.20)
RDW: 14.7 % — ABNORMAL HIGH (ref 11.5–14.5)
WBC: 7.2 10*3/uL (ref 3.6–11.0)

## 2016-05-07 LAB — TYPE AND SCREEN
ABO/RH(D): A POS
Antibody Screen: NEGATIVE

## 2016-05-07 MED ORDER — SOD CITRATE-CITRIC ACID 500-334 MG/5ML PO SOLN: 30.0000 mL | ORAL | Status: DC | PRN

## 2016-05-07 MED ORDER — ONDANSETRON HCL 4 MG/2ML IJ SOLN
INTRAMUSCULAR | Status: AC
  Administered 2016-05-07: 4 mg via INTRAVENOUS
  Filled 2016-05-07: qty 2

## 2016-05-07 MED ORDER — ACETAMINOPHEN 325 MG PO TABS: 650.0000 mg | ORAL_TABLET | ORAL | Status: DC | PRN

## 2016-05-07 MED ORDER — FENTANYL 2.5 MCG/ML W/ROPIVACAINE 0.2% IN NS 100 ML EPIDURAL INFUSION (ARMC-ANES)
EPIDURAL | Status: DC | PRN
  Administered 2016-05-07: 10 mL/h via EPIDURAL

## 2016-05-07 MED ORDER — ONDANSETRON HCL 4 MG/2ML IJ SOLN
4.0000 mg | Freq: Three times a day (TID) | INTRAMUSCULAR | Status: DC | PRN
  Administered 2016-05-07: 4 mg via INTRAVENOUS

## 2016-05-07 MED ORDER — NALBUPHINE HCL 10 MG/ML IJ SOLN: 5.0000 mg | Freq: Once | INTRAMUSCULAR | Status: DC | PRN

## 2016-05-07 MED ORDER — TERBUTALINE SULFATE 1 MG/ML IJ SOLN: 0.2500 mg | Freq: Once | INTRAMUSCULAR | Status: DC | PRN

## 2016-05-07 MED ORDER — LIDOCAINE HCL (PF) 1 % IJ SOLN
30.0000 mL | INTRAMUSCULAR | Status: AC | PRN
  Administered 2016-05-07: 3 mL via SUBCUTANEOUS
  Filled 2016-05-07: qty 30

## 2016-05-07 MED ORDER — DIPHENHYDRAMINE HCL 50 MG/ML IJ SOLN: 12.5000 mg | INTRAMUSCULAR | Status: DC | PRN

## 2016-05-07 MED ORDER — SCOPOLAMINE 1 MG/3DAYS TD PT72
1.0000 | MEDICATED_PATCH | Freq: Once | TRANSDERMAL | Status: DC
Start: 2016-05-07 — End: 2016-05-08

## 2016-05-07 MED ORDER — OXYTOCIN 40 UNITS IN LACTATED RINGERS INFUSION - SIMPLE MED
2.5000 [IU]/h | INTRAVENOUS | Status: DC
  Administered 2016-05-08: 39.96 [IU]/h via INTRAVENOUS

## 2016-05-07 MED ORDER — OXYTOCIN BOLUS FROM INFUSION: 500.0000 mL | Freq: Once | INTRAVENOUS | Status: DC

## 2016-05-07 MED ORDER — LACTATED RINGERS IV SOLN: 500.0000 mL | INTRAVENOUS | Status: DC | PRN

## 2016-05-07 MED ORDER — BUPIVACAINE HCL (PF) 0.25 % IJ SOLN
INTRAMUSCULAR | Status: DC | PRN
  Administered 2016-05-07: 5 mL via EPIDURAL

## 2016-05-07 MED ORDER — FENTANYL CITRATE (PF) 100 MCG/2ML IJ SOLN
50.0000 ug | INTRAMUSCULAR | Status: DC | PRN
  Administered 2016-05-07: 50 ug via INTRAVENOUS
  Filled 2016-05-07: qty 2

## 2016-05-07 MED ORDER — IBUPROFEN 600 MG PO TABS
600.0000 mg | ORAL_TABLET | Freq: Four times a day (QID) | ORAL | Status: DC | PRN
  Administered 2016-05-08: 600 mg via ORAL
  Filled 2016-05-07: qty 1

## 2016-05-07 MED ORDER — MISOPROSTOL 200 MCG PO TABS
ORAL_TABLET | ORAL | Status: AC
  Filled 2016-05-07: qty 4

## 2016-05-07 MED ORDER — FENTANYL 2.5 MCG/ML W/ROPIVACAINE 0.2% IN NS 100 ML EPIDURAL INFUSION (ARMC-ANES)
EPIDURAL | Status: AC
  Filled 2016-05-07: qty 100

## 2016-05-07 MED ORDER — ONDANSETRON HCL 4 MG/2ML IJ SOLN: 4.0000 mg | Freq: Four times a day (QID) | INTRAMUSCULAR | Status: DC | PRN

## 2016-05-07 MED ORDER — DIPHENHYDRAMINE HCL 25 MG PO CAPS: 25.0000 mg | ORAL_CAPSULE | ORAL | Status: DC | PRN

## 2016-05-07 MED ORDER — LACTATED RINGERS IV SOLN
INTRAVENOUS | Status: DC
  Administered 2016-05-07 – 2016-05-08 (×3): via INTRAVENOUS

## 2016-05-07 MED ORDER — FENTANYL 2.5 MCG/ML W/ROPIVACAINE 0.2% IN NS 100 ML EPIDURAL INFUSION (ARMC-ANES): 10.0000 mL/h | EPIDURAL | Status: DC

## 2016-05-07 MED ORDER — NALOXONE HCL 0.4 MG/ML IJ SOLN: 0.4000 mg | INTRAMUSCULAR | Status: DC | PRN

## 2016-05-07 MED ORDER — OXYTOCIN 40 UNITS IN LACTATED RINGERS INFUSION - SIMPLE MED
1.0000 m[IU]/min | INTRAVENOUS | Status: DC
  Administered 2016-05-07: 1 m[IU]/min via INTRAVENOUS
  Filled 2016-05-07: qty 1000

## 2016-05-07 MED ORDER — NALBUPHINE HCL 10 MG/ML IJ SOLN: 5.0000 mg | INTRAMUSCULAR | Status: DC | PRN

## 2016-05-07 MED ORDER — LIDOCAINE-EPINEPHRINE (PF) 1.5 %-1:200000 IJ SOLN
INTRAMUSCULAR | Status: DC | PRN
  Administered 2016-05-07: 4 mL via EPIDURAL

## 2016-05-07 MED ORDER — DEXTROSE 5 % IV SOLN: 1.0000 ug/kg/h | INTRAVENOUS | Status: DC | PRN

## 2016-05-07 MED ORDER — SODIUM CHLORIDE 0.9% FLUSH: 3.0000 mL | INTRAVENOUS | Status: DC | PRN

## 2016-05-07 MED ORDER — AMMONIA AROMATIC IN INHA
RESPIRATORY_TRACT | Status: AC
  Filled 2016-05-07: qty 10

## 2016-05-07 MED ORDER — MEPERIDINE HCL 25 MG/ML IJ SOLN: 6.2500 mg | INTRAMUSCULAR | Status: DC | PRN

## 2016-05-07 NOTE — Anesthesia Preprocedure Evaluation (Signed)
Anesthesia Evaluation  Patient identified by MRN, date of birth, ID band Patient awake    Reviewed: Allergy & Precautions, H&P , NPO status , Patient's Chart, lab work & pertinent test results, reviewed documented beta blocker date and time   Airway Mallampati: II  TM Distance: >3 FB Neck ROM: full    Dental no notable dental hx. (+) Teeth Intact   Pulmonary neg pulmonary ROS,    Pulmonary exam normal breath sounds clear to auscultation       Cardiovascular Exercise Tolerance: Good negative cardio ROS Normal cardiovascular exam Rhythm:regular Rate:Normal     Neuro/Psych  Headaches, negative neurological ROS  negative psych ROS   GI/Hepatic negative GI ROS, Neg liver ROS,   Endo/Other  negative endocrine ROS  Renal/GU negative Renal ROS  negative genitourinary   Musculoskeletal   Abdominal   Peds  Hematology negative hematology ROS (+)   Anesthesia Other Findings   Reproductive/Obstetrics (+) Pregnancy                             Anesthesia Physical Anesthesia Plan  ASA: II  Anesthesia Plan: Regional and Epidural   Post-op Pain Management:    Induction:   Airway Management Planned:   Additional Equipment:   Intra-op Plan:   Post-operative Plan:   Informed Consent: I have reviewed the patients History and Physical, chart, labs and discussed the procedure including the risks, benefits and alternatives for the proposed anesthesia with the patient or authorized representative who has indicated his/her understanding and acceptance.     Plan Discussed with: CRNA  Anesthesia Plan Comments:         Anesthesia Quick Evaluation

## 2016-05-07 NOTE — Progress Notes (Signed)
Hayley Kirk is a 30 y.o. [id number] at [redacted]w[redacted]d by LMP admitted for induction of labor due to Elective at term.  Subjective:  Reports moderate pain with contractions- desires epidural now Objective: BP 126/82   Pulse 73   Temp 98.3 F (36.8 C) (Oral)   Resp 18   Ht 5\' 4"  (1.626 m)   Wt 173 lb (78.5 kg)   LMP 08/07/2015   BMI 29.70 kg/m  I/O last 3 completed shifts: In: 487.8 [I.V.:487.8] Out: -  No intake/output data recorded.  FHT:  FHR: 150 bpm, variability: minimal ,  accelerations:  Present,  decelerations:  Absent UC:   irregular, every 4-5 minutes, moderate to palpation, on pitocin SVE:   Dilation: 5 Effacement (%): 80 Station: -1 Exam by:: [employer] CNM  Labs: Lab Results  Component Value Date   WBC 7.2 05/07/2016   HGB 12.4 05/07/2016   HCT 35.9 05/07/2016   MCV 96.6 05/07/2016   PLT 115 (L) 05/07/2016    Assessment / Plan: Induction of labor due to term with favorable cervix,  progressing well on pitocin  Labor: progressing on pitocin Preeclampsia:  labs stable Fetal Wellbeing:  Category I Pain Control:  Labor support without medications I/D:  n/a Anticipated MOD:  NSVD  [employer], CNM 05/07/2016, 9:05 PM

## 2016-05-07 NOTE — Progress Notes (Signed)
   05/07/16 1530  Clinical Encounter Type  Visited With Patient and family together  Visit Type Initial;Spiritual support  Referral From Nurse  Consult/Referral To Chaplain  Spiritual Encounters  Spiritual Needs Literature;Prayer;Ritual  Stress Factors  Patient Stress Factors Exhausted;Major life changes  Family Stress Factors Major life changes  Advance Directives (For Healthcare)  Does patient have an advance directive? No  Would patient like information on creating an advanced directive? Yes - Transport planner given  Visited patient and provided AD education and documents. Patient plans to take forms with her to complete later. She initially appeared anxious, but calmed as we talked. She asked for blessings to the room and herself in preparation for the birth of her child. I prayed blessings on her and the room. Advised her that she can have the nursing staff page if there is any additional support that she desires.  Chap. Heraclio Seidman G. Mirabel Ahlgren, ext. [phone]

## 2016-05-07 NOTE — Anesthesia Procedure Notes (Signed)
Epidural Patient location during procedure: OB  Staffing Anesthesiologist: Margorie John K Performed: anesthesiologist   Preanesthetic Checklist Completed: patient identified, site marked, surgical consent, pre-op evaluation, timeout performed, IV checked, risks and benefits discussed and monitors and equipment checked  Epidural Patient position: sitting Prep: Betadine Patient monitoring: heart rate, continuous pulse ox and blood pressure Approach: midline Location: L4-L5 Injection technique: LOR saline  Needle:  Needle type: Tuohy  Needle gauge: 18 G Needle length: 9 cm and 9 Catheter type: closed end flexible Catheter size: 20 Guage Test dose: negative and 1.5% lidocaine with Epi 1:200 K  Assessment Sensory level: T10 Events: blood not aspirated, injection not painful, no injection resistance, negative IV test and no paresthesia  Additional Notes Pt. Evaluated and documentation done after procedure finished. Patient identified. Risks/Benefits/Options discussed with patient including but not limited to bleeding, infection, nerve damage, paralysis, failed block, incomplete pain control, headache, blood pressure changes, nausea, vomiting, reactions to medication both or allergic, itching and postpartum back pain. Confirmed with bedside nurse the patient's most recent platelet count. Confirmed with patient that they are not currently taking any anticoagulation, have any bleeding history or any family history of bleeding disorders. Patient expressed understanding and wished to proceed. All questions were answered. Sterile technique was used throughout the entire procedure. Please see nursing notes for vital signs. Test dose was given through epidural catheter and negative prior to continuing to dose epidural or start infusion. Warning signs of high block given to the patient including shortness of breath, tingling/numbness in hands, complete motor block, or any concerning symptoms  with instructions to call for help. Patient was given instructions on fall risk and not to get out of bed. All questions and concerns addressed with instructions to call with any issues or inadequate analgesia.   Patient tolerated the insertion well without complications.  Reason for block:procedure for pain

## 2016-05-08 DIAGNOSIS — Z3493 Encounter for supervision of normal pregnancy, unspecified, third trimester: Secondary | ICD-10-CM

## 2016-05-08 LAB — CBC
HEMATOCRIT: 35 % (ref 35.0–47.0)
Hemoglobin: 11.8 g/dL — ABNORMAL LOW (ref 12.0–16.0)
MCH: 32.5 pg (ref 26.0–34.0)
MCHC: 33.7 g/dL (ref 32.0–36.0)
MCV: 96.5 fL (ref 80.0–100.0)
Platelets: 104 10*3/uL — ABNORMAL LOW (ref 150–440)
RBC: 3.63 MIL/uL — ABNORMAL LOW (ref 3.80–5.20)
RDW: 14.5 % (ref 11.5–14.5)
WBC: 9.5 10*3/uL (ref 3.6–11.0)

## 2016-05-08 LAB — RPR: RPR Ser Ql: NONREACTIVE

## 2016-05-08 MED ORDER — HYDROCODONE-ACETAMINOPHEN 5-325 MG PO TABS
1.0000 | ORAL_TABLET | ORAL | Status: DC | PRN
  Administered 2016-05-08 – 2016-05-09 (×4): 1 via ORAL
  Filled 2016-05-08 (×4): qty 1

## 2016-05-08 MED ORDER — ZOLPIDEM TARTRATE 5 MG PO TABS: 5.0000 mg | ORAL_TABLET | Freq: Every evening | ORAL | Status: DC | PRN

## 2016-05-08 MED ORDER — SENNOSIDES-DOCUSATE SODIUM 8.6-50 MG PO TABS
2.0000 | ORAL_TABLET | ORAL | Status: DC
Start: 2016-05-09 — End: 2016-05-09

## 2016-05-08 MED ORDER — PRENATAL MULTIVITAMIN CH
1.0000 | ORAL_TABLET | Freq: Every day | ORAL | Status: DC
  Administered 2016-05-08 – 2016-05-09 (×2): 1 via ORAL
  Filled 2016-05-08 (×2): qty 1

## 2016-05-08 MED ORDER — DIPHENHYDRAMINE HCL 25 MG PO CAPS: 25.0000 mg | ORAL_CAPSULE | Freq: Four times a day (QID) | ORAL | Status: DC | PRN

## 2016-05-08 MED ORDER — ACETAMINOPHEN 325 MG PO TABS
650.0000 mg | ORAL_TABLET | ORAL | Status: DC | PRN
  Administered 2016-05-08: 650 mg via ORAL
  Filled 2016-05-08 (×2): qty 2

## 2016-05-08 MED ORDER — SIMETHICONE 80 MG PO CHEW: 80.0000 mg | CHEWABLE_TABLET | ORAL | Status: DC | PRN

## 2016-05-08 MED ORDER — IBUPROFEN 600 MG PO TABS
600.0000 mg | ORAL_TABLET | Freq: Four times a day (QID) | ORAL | Status: DC
  Administered 2016-05-08 – 2016-05-09 (×5): 600 mg via ORAL
  Filled 2016-05-08 (×5): qty 1

## 2016-05-08 MED ORDER — ONDANSETRON HCL 4 MG PO TABS: 4.0000 mg | ORAL_TABLET | ORAL | Status: DC | PRN

## 2016-05-08 MED ORDER — WITCH HAZEL-GLYCERIN EX PADS: 1.0000 "application " | MEDICATED_PAD | CUTANEOUS | Status: DC | PRN

## 2016-05-08 MED ORDER — MEASLES, MUMPS & RUBELLA VAC ~~LOC~~ INJ
0.5000 mL | INJECTION | Freq: Once | SUBCUTANEOUS | Status: AC
  Administered 2016-05-09: 0.5 mL via SUBCUTANEOUS
  Filled 2016-05-08: qty 0.5

## 2016-05-08 MED ORDER — COCONUT OIL OIL
1.0000 "application " | TOPICAL_OIL | Status: DC | PRN
  Filled 2016-05-08: qty 120

## 2016-05-08 MED ORDER — IBUPROFEN 600 MG PO TABS: 600.0000 mg | ORAL_TABLET | Freq: Four times a day (QID) | ORAL | Status: DC

## 2016-05-08 MED ORDER — DOCUSATE SODIUM 100 MG PO CAPS
100.0000 mg | ORAL_CAPSULE | Freq: Two times a day (BID) | ORAL | Status: DC
  Administered 2016-05-08 – 2016-05-09 (×2): 100 mg via ORAL
  Filled 2016-05-08 (×2): qty 1

## 2016-05-08 MED ORDER — VARICELLA VIRUS VACCINE LIVE 1350 PFU/0.5ML IJ SUSR
0.5000 mL | Freq: Once | INTRAMUSCULAR | Status: AC
  Administered 2016-05-09: 0.5 mL via SUBCUTANEOUS
  Filled 2016-05-08: qty 0.5

## 2016-05-08 MED ORDER — DIBUCAINE 1 % RE OINT: 1.0000 "application " | TOPICAL_OINTMENT | RECTAL | Status: DC | PRN

## 2016-05-08 MED ORDER — BENZOCAINE-MENTHOL 20-0.5 % EX AERO
1.0000 "application " | INHALATION_SPRAY | CUTANEOUS | Status: DC | PRN
  Filled 2016-05-08: qty 56

## 2016-05-08 MED ORDER — ONDANSETRON HCL 4 MG/2ML IJ SOLN: 4.0000 mg | INTRAMUSCULAR | Status: DC | PRN

## 2016-05-08 NOTE — Anesthesia Postprocedure Evaluation (Signed)
Anesthesia Post Note  Patient: Hayley Kirk  Procedure(s) Performed: * No procedures listed *  Patient location during evaluation: Mother Baby Anesthesia Type: Epidural Level of consciousness: awake and alert Pain management: pain level controlled Vital Signs Assessment: post-procedure vital signs reviewed and stable Respiratory status: spontaneous breathing, nonlabored ventilation and respiratory function stable Cardiovascular status: stable Postop Assessment: no headache, no backache and epidural receding Anesthetic complications: no    Last Vitals:  Vitals:   05/08/16 1136 05/08/16 1619  BP: (!) 100/48 103/66  Pulse: (!) 57 (!) 58  Resp: 18   Temp: 36.6 C 36.9 C    Last Pain:  Vitals:   05/08/16 1619  TempSrc: Oral  PainSc:                  [name]

## 2016-05-09 MED ORDER — VITAMIN D3 125 MCG (5000 UT) PO CAPS: 1.0000 | ORAL_CAPSULE | Freq: Every day | ORAL | 2 refills | Status: DC

## 2016-05-09 MED ORDER — MEDROXYPROGESTERONE ACETATE 150 MG/ML IM SUSP: 150.0000 mg | INTRAMUSCULAR | 4 refills | Status: DC

## 2016-05-09 NOTE — Progress Notes (Signed)
Patient discharged home with infant and significant other. Discharge instructions, prescriptions and follow up appointment given to and reviewed with patient and significant other. Patient verbalized understanding. Escorted out via wheelchair by Louann Sjogren, NT.

## 2016-05-09 NOTE — Discharge Summary (Signed)
Obstetric Discharge Summary Reason for Admission: induction of labor Prenatal Procedures: ultrasound Intrapartum Procedures: spontaneous vaginal delivery Postpartum Procedures: Rubella Ig and varicella vaccine Complications-Operative and Postpartum: none Hemoglobin  Date Value Ref Range Status  05/08/2016 11.8 (L) 12.0 - 16.0 g/dL Final   HGB  Date Value Ref Range Status  05/11/2014 13.4 12.0 - 16.0 g/dL Final   HCT  Date Value Ref Range Status  05/08/2016 35.0 35.0 - 47.0 % Final   Hematocrit  Date Value Ref Range Status  02/18/2016 35.3 34.0 - 46.6 % Final    Physical Exam:  General: alert, cooperative and appears stated age Lochia: appropriate Uterine Fundus: firm Incision: NA DVT Evaluation: No evidence of DVT seen on physical exam. Negative Homan's sign.  Discharge Diagnoses: Term Pregnancy-delivered  Discharge Information: Date: 05/09/2016 Activity: pelvic rest Diet: routine Medications: PNV, Ibuprofen, Colace and Vit D3 and Depo (to be administered at 6 weeks PP) Condition: stable Instructions: refer to practice specific booklet Discharge to: home   Newborn Data: Live born female "Milani" Birth Weight: 8 lb 8.2 oz (3860 g) APGAR: 8, 9  Home with mother.  Hadar Elgersma [employer], CNM 05/09/2016, 8:12 AM

## 2016-05-10 ENCOUNTER — Emergency Department: Payer: Medicaid Other

## 2016-05-10 ENCOUNTER — Encounter: Payer: Self-pay | Admitting: Radiology

## 2016-05-10 ENCOUNTER — Inpatient Hospital Stay
Admission: EM | Admit: 2016-05-10 | Discharge: 2016-05-11 | DRG: 313 | Disposition: A | Discharge status: Home / Self Care | Payer: Medicaid Other | Attending: Internal Medicine | Admitting: Internal Medicine

## 2016-05-10 DIAGNOSIS — M7989 Other specified soft tissue disorders: Secondary | ICD-10-CM

## 2016-05-10 DIAGNOSIS — O1205 Gestational edema, complicating the puerperium: Secondary | ICD-10-CM | POA: Diagnosis present

## 2016-05-10 DIAGNOSIS — R600 Localized edema: Secondary | ICD-10-CM | POA: Diagnosis present

## 2016-05-10 DIAGNOSIS — O8620 Urinary tract infection following delivery, unspecified: Secondary | ICD-10-CM | POA: Diagnosis present

## 2016-05-10 DIAGNOSIS — J690 Pneumonitis due to inhalation of food and vomit: Secondary | ICD-10-CM

## 2016-05-10 DIAGNOSIS — R0789 Other chest pain: Principal | ICD-10-CM | POA: Diagnosis present

## 2016-05-10 DIAGNOSIS — R079 Chest pain, unspecified: Secondary | ICD-10-CM

## 2016-05-10 DIAGNOSIS — N390 Urinary tract infection, site not specified: Secondary | ICD-10-CM | POA: Diagnosis present

## 2016-05-10 DIAGNOSIS — R0781 Pleurodynia: Secondary | ICD-10-CM | POA: Diagnosis present

## 2016-05-10 HISTORY — DX: Migraine, unspecified, not intractable, without status migrainosus: G43.909

## 2016-05-10 LAB — URINALYSIS COMPLETE WITH MICROSCOPIC (ARMC ONLY)
BILIRUBIN URINE: NEGATIVE
GLUCOSE, UA: NEGATIVE mg/dL
KETONES UR: NEGATIVE mg/dL
NITRITE: NEGATIVE
PH: 6 (ref 5.0–8.0)
Protein, ur: 30 mg/dL — AB
SPECIFIC GRAVITY, URINE: 1.06 — AB (ref 1.005–1.030)

## 2016-05-10 LAB — CBC WITH DIFFERENTIAL/PLATELET
Basophils Absolute: 0.1 K/uL (ref 0–0.1)
Basophils Relative: 1 %
Eosinophils Absolute: 0.3 K/uL (ref 0–0.7)
Eosinophils Relative: 4 %
HCT: 33 % — ABNORMAL LOW (ref 35.0–47.0)
Hemoglobin: 11.3 g/dL — ABNORMAL LOW (ref 12.0–16.0)
Lymphocytes Relative: 22 %
Lymphs Abs: 1.8 K/uL (ref 1.0–3.6)
MCH: 32.8 pg (ref 26.0–34.0)
MCHC: 34.2 g/dL (ref 32.0–36.0)
MCV: 96.1 fL (ref 80.0–100.0)
Monocytes Absolute: 0.7 K/uL (ref 0.2–0.9)
Monocytes Relative: 9 %
Neutro Abs: 5 K/uL (ref 1.4–6.5)
Neutrophils Relative %: 64 %
Platelets: 147 K/uL — ABNORMAL LOW (ref 150–440)
RBC: 3.43 MIL/uL — ABNORMAL LOW (ref 3.80–5.20)
RDW: 14.9 % — ABNORMAL HIGH (ref 11.5–14.5)
WBC: 7.9 K/uL (ref 3.6–11.0)

## 2016-05-10 LAB — COMPREHENSIVE METABOLIC PANEL WITH GFR
ALT: 30 U/L (ref 14–54)
AST: 41 U/L (ref 15–41)
Albumin: 2.8 g/dL — ABNORMAL LOW (ref 3.5–5.0)
Alkaline Phosphatase: 98 U/L (ref 38–126)
Anion gap: 6 (ref 5–15)
BUN: 10 mg/dL (ref 6–20)
CO2: 24 mmol/L (ref 22–32)
Calcium: 9 mg/dL (ref 8.9–10.3)
Chloride: 107 mmol/L (ref 101–111)
Creatinine, Ser: 0.59 mg/dL (ref 0.44–1.00)
GFR calc Af Amer: >60 mL/min
GFR calc non Af Amer: >60 mL/min
Glucose, Bld: 93 mg/dL (ref 65–99)
Potassium: 3.6 mmol/L (ref 3.5–5.1)
Sodium: 137 mmol/L (ref 135–145)
Total Bilirubin: 0.4 mg/dL (ref 0.3–1.2)
Total Protein: 6.3 g/dL — ABNORMAL LOW (ref 6.5–8.1)

## 2016-05-10 LAB — BRAIN NATRIURETIC PEPTIDE: B Natriuretic Peptide: 173 pg/mL — ABNORMAL HIGH (ref 0.0–100.0)

## 2016-05-10 LAB — TROPONIN I: Troponin I: <0.03 ng/mL (ref <0.03)

## 2016-05-10 LAB — LIPASE, BLOOD: Lipase: 24 U/L (ref 11–51)

## 2016-05-10 MED ORDER — SODIUM CHLORIDE 0.9 % IV BOLUS (SEPSIS)
1000.0000 mL | Freq: Once | INTRAVENOUS | Status: AC
  Administered 2016-05-10: 1000 mL via INTRAVENOUS

## 2016-05-10 MED ORDER — ONDANSETRON HCL 4 MG/2ML IJ SOLN
4.0000 mg | Freq: Once | INTRAMUSCULAR | Status: AC
  Administered 2016-05-10: 4 mg via INTRAVENOUS
  Filled 2016-05-10: qty 2

## 2016-05-10 MED ORDER — KETOROLAC TROMETHAMINE 30 MG/ML IJ SOLN
15.0000 mg | Freq: Once | INTRAMUSCULAR | Status: AC
  Administered 2016-05-10: 15 mg via INTRAVENOUS
  Filled 2016-05-10: qty 1

## 2016-05-10 MED ORDER — IOPAMIDOL (ISOVUE-370) INJECTION 76%
100.0000 mL | Freq: Once | INTRAVENOUS | Status: AC | PRN
  Administered 2016-05-10: 100 mL via INTRAVENOUS

## 2016-05-10 MED ORDER — DEXTROSE 5 % IV SOLN
1.0000 g | Freq: Once | INTRAVENOUS | Status: AC
  Administered 2016-05-10: 1 g via INTRAVENOUS
  Filled 2016-05-10: qty 10

## 2016-05-10 MED ORDER — DEXTROSE 5 % IV SOLN
500.0000 mg | Freq: Once | INTRAVENOUS | Status: AC
  Administered 2016-05-11: 500 mg via INTRAVENOUS
  Filled 2016-05-10: qty 500

## 2016-05-10 NOTE — ED Notes (Signed)
Pt at CTA

## 2016-05-10 NOTE — ED Notes (Signed)
Pt was connected to the heart monitor and given a warm blanket per request. EKG was completed. RN notified

## 2016-05-10 NOTE — ED Triage Notes (Signed)
Pt in with co chest pain, bilat feet swelling and left lower abd pain. Pt had normal vaginal delivery Sunday and was discharged yesterday.

## 2016-05-10 NOTE — ED Notes (Signed)
Pt informed that urine specimen needed, states unable now but will try again.

## 2016-05-10 NOTE — ED Provider Notes (Addendum)
[facility] Emergency Department Provider Note   ____________________________________________   First MD Initiated Contact with Patient 05/10/16 [phone]     (approximate)  I have reviewed the triage vital signs and the nursing notes.   HISTORY  Chief Complaint Abdominal Pain    HPI Hayley Kirk is a 30 y.o. female postpartum day #2 status post vaginal delivery who presents for evaluation of pleuritic chest pain as well as mild shortness of breath and bilateral lower extremity edema, ongoing since yesterday, constant, no modifying factors. She is also having some left lower quadrant pain, no vomiting, diarrhea, fevers or chills. No foul-smelling lochia. No pain or burning with urination.   Past Medical History:  Diagnosis Date  . Headache    hx of migraines    Patient Active Problem List   Diagnosis Date Noted  . Labor and delivery, indication for care 05/07/2016  . Family history of breast cancer in mother 10/29/2015  . Rubella non-immune status, antepartum 09/21/2015  . History of preterm delivery, currently pregnant 09/21/2015  . Susceptible to Varicella (non-immune), currently pregnant in first trimester 09/21/2015    Past Surgical History:  Procedure Laterality Date  . DILATION AND CURETTAGE OF UTERUS      Prior to Admission medications   Medication Sig Start Date End Date Taking? Authorizing Provider  ibuprofen (ADVIL,MOTRIN) 200 MG tablet Take 600 mg by mouth every 6 (six) hours as needed for mild pain or moderate pain.   Yes Historical Provider, MD  Prenatal Vit-Fe Fumarate-FA (PRENATAL MULTIVITAMIN) TABS tablet Take 1 tablet by mouth daily at 12 noon.   Yes Historical Provider, MD    Allergies Review of patient's allergies indicates no known allergies.  Family History  Problem Relation Age of Onset  . Breast cancer Mother     stage 4  . Stomach cancer Mother   . Diabetes Mother   . Hyperlipidemia Mother   . Hypertension Mother    . Stroke Mother   . Heart failure Father   . Breast cancer Maternal Aunt     x2  . Stomach cancer Maternal Aunt     passed away 67yrs ago  . Migraines Sister     Social History Social History  Substance Use Topics  . Smoking status: Never Smoker  . Smokeless tobacco: Never Used  . Alcohol use No    Review of Systems Constitutional: No fever/chills Eyes: No visual changes. ENT: No sore throat. Cardiovascular: +chest pain. Respiratory: + shortness of breath. Gastrointestinal: No abdominal pain.  No nausea, no vomiting.  No diarrhea.  No constipation. Genitourinary: Negative for dysuria. Musculoskeletal: Negative for back pain. Skin: Negative for rash. Neurological: Negative for headaches, focal weakness or numbness.  10-point ROS otherwise negative.  ____________________________________________   PHYSICAL EXAM:  Vitals:   05/10/16 2200 05/10/16 2230 05/10/16 2251 05/10/16 2337  BP: 117/82 130/77  114/73  Pulse:   (!) 48 (!) 47  Resp:   19 15  Temp:      TempSrc:      SpO2:   99% 99%  Weight:      Height:        VITAL SIGNS: ED Triage Vitals  Enc Vitals Group     BP 05/10/16 2023 (!) 143/91     Pulse Rate 05/10/16 2023 70     Resp 05/10/16 2023 18     Temp 05/10/16 2023 98.2 F (36.8 C)     Temp Source 05/10/16 2023 Oral  SpO2 05/10/16 2023 99 %     Weight 05/10/16 2023 175 lb (79.4 kg)     Height 05/10/16 2023 5\' 4"  (1.626 m)     Head Circumference --      Peak Flow --      Pain Score 05/10/16 2024 7     Pain Loc --      Pain Edu? --      Excl. in GC? --     Constitutional: Alert and oriented. Well appearing and in no acute distress. Eyes: Conjunctivae are normal. PERRL. EOMI. Head: Atraumatic. Nose: No congestion/rhinnorhea. Mouth/Throat: Mucous membranes are moist.  Oropharynx non-erythematous. Neck: No stridor.Supple without meningismus. Cardiovascular: Normal rate, regular rhythm. Grossly normal heart sounds.  Good peripheral  circulation. Respiratory: Normal respiratory effort.  No retractions. Lungs CTAB. Gastrointestinal: Soft and nontender. No distention. Forearm nontender uterine fundus palpated just below the level of the umbilicus. No tenderness to palpation in the right or left lower quadrant. Genitourinary: Deferred Musculoskeletal: Trace nonpitting edema bilateral lower extremities.  No joint effusions. Neurologic:  Normal speech and language. No gross focal neurologic deficits are appreciated. No gait instability. Skin:  Skin is warm, dry and intact. No rash noted. Psychiatric: Mood and affect are normal. Speech and behavior are normal.  ____________________________________________   LABS (all labs ordered are listed, but only abnormal results are displayed)  Labs Reviewed  CBC WITH DIFFERENTIAL/PLATELET - Abnormal; Notable for the following:       Result Value   RBC 3.43 (*)    Hemoglobin 11.3 (*)    HCT 33.0 (*)    RDW 14.9 (*)    Platelets 147 (*)    All other components within normal limits  COMPREHENSIVE METABOLIC PANEL - Abnormal; Notable for the following:    Total Protein 6.3 (*)    Albumin 2.8 (*)    All other components within normal limits  URINALYSIS COMPLETEWITH MICROSCOPIC ([facility] ONLY) - Abnormal; Notable for the following:    Color, Urine YELLOW (*)    APPearance CLEAR (*)    Specific Gravity, Urine 1.060 (*)    Hgb urine dipstick 3+ (*)    Protein, ur 30 (*)    Leukocytes, UA 2+ (*)    Bacteria, UA RARE (*)    Squamous Epithelial / LPF 0-5 (*)    All other components within normal limits  BRAIN NATRIURETIC PEPTIDE - Abnormal; Notable for the following:    B Natriuretic Peptide 173.0 (*)    All other components within normal limits  CULTURE, BLOOD (ROUTINE X 2)  CULTURE, BLOOD (ROUTINE X 2)  LIPASE, BLOOD  TROPONIN I   ____________________________________________  EKG  ED ECG REPORT I, Gayla Doss, the attending physician, personally viewed and interpreted this  ECG.   Date: 05/10/2016  EKG Time: 20:35  Rate: 58  Rhythm: normal EKG, normal sinus rhythm  Axis: normal  Intervals:none  ST&T Change: No acute ST elevation or acute ST depression.  ____________________________________________  RADIOLOGY  CTA chest IMPRESSION: 1. No pulmonary embolus. 2. Faint ground-glass and reticulonodular opacities in the right upper lobe, likely infectious or inflammatory. Minimal aspiration could have this appearance.  CXR IMPRESSION: Normal exam.  ____________________________________________   PROCEDURES  Procedure(s) performed: None  Procedures  Critical Care performed: No  ____________________________________________   INITIAL IMPRESSION / ASSESSMENT AND PLAN / ED COURSE  Pertinent labs & imaging results that were available during my care of the patient were reviewed by me and considered in my medical decision making (  see chart for details).  Hayley Kirk is a 30 y.o. female postpartum day #2 status post vaginal delivery who presents for evaluation of pleuritic chest pain as well as mild shortness of breath and bilateral lower extremity edema. On exam, she is generally well-appearing and in no acute distress. Her vital signs are stable and she is afebrile. EKG is reassuring. Given recent pregnancy/delivery, will obtain CTA chest to rule out pulmonary embolism, we'll treat her symptomatically, obtain screening labs and reassess for disposition.  ----------------------------------------- 11:38 PM on 05/10/2016 ----------------------------------------- I reviewed the patient's labs. CBC with mild anemia. CMP generally unremarkable, negative troponin. Chest x-ray clear, CTA chest negative for PE however groundglass opacities concerning for possible pneumonia. BNP is also mildly elevated and given bilateral leg swelling, I think the patient merits admission for cardiology evaluation to evaluate for a postpartum cardiomyopathy. Will give IV  ceftriaxone and azithromycin. I discussed the case with Dr. Valentino Saxon, on call for OB/GYN ,who recommended hospitalist admission. Case discussed with hospitalist for admission at this time.   Clinical Course     ____________________________________________   FINAL CLINICAL IMPRESSION(S) / ED DIAGNOSES  Final diagnoses:  Chest pain, unspecified chest pain type  Leg swelling  Aspiration pneumonia, unspecified aspiration pneumonia type, unspecified laterality, unspecified part of lung (HCC)      NEW MEDICATIONS STARTED DURING THIS VISIT:  New Prescriptions   No medications on file     Note:  This document was prepared using Dragon voice recognition software and may include unintentional dictation errors.    Gayla Doss, MD 05/10/16 2350    Gayla Doss, MD 05/10/16 2350

## 2016-05-11 ENCOUNTER — Encounter: Payer: Self-pay | Admitting: Internal Medicine

## 2016-05-11 ENCOUNTER — Inpatient Hospital Stay
Admit: 2016-05-11 | Discharge: 2016-05-11 | Disposition: A | Discharge status: Home / Self Care | Payer: Medicaid Other | Attending: Internal Medicine | Admitting: Internal Medicine

## 2016-05-11 DIAGNOSIS — R0789 Other chest pain: Secondary | ICD-10-CM | POA: Diagnosis present

## 2016-05-11 DIAGNOSIS — O1205 Gestational edema, complicating the puerperium: Secondary | ICD-10-CM | POA: Diagnosis present

## 2016-05-11 DIAGNOSIS — N390 Urinary tract infection, site not specified: Secondary | ICD-10-CM | POA: Diagnosis present

## 2016-05-11 DIAGNOSIS — R600 Localized edema: Secondary | ICD-10-CM | POA: Diagnosis present

## 2016-05-11 DIAGNOSIS — O8620 Urinary tract infection following delivery, unspecified: Secondary | ICD-10-CM | POA: Diagnosis present

## 2016-05-11 DIAGNOSIS — R0781 Pleurodynia: Secondary | ICD-10-CM | POA: Diagnosis present

## 2016-05-11 LAB — CREATININE, SERUM
Creatinine, Ser: 0.59 mg/dL (ref 0.44–1.00)
GFR calc non Af Amer: >60 mL/min (ref >60)

## 2016-05-11 LAB — TROPONIN I

## 2016-05-11 LAB — BASIC METABOLIC PANEL
Anion gap: 6 (ref 5–15)
BUN: 8 mg/dL (ref 6–20)
CALCIUM: 8.5 mg/dL — AB (ref 8.9–10.3)
CHLORIDE: 109 mmol/L (ref 101–111)
CO2: 24 mmol/L (ref 22–32)
CREATININE: 0.64 mg/dL (ref 0.44–1.00)
Glucose, Bld: 85 mg/dL (ref 65–99)
Potassium: 3.9 mmol/L (ref 3.5–5.1)
SODIUM: 139 mmol/L (ref 135–145)

## 2016-05-11 LAB — CBC
HCT: 33 % — ABNORMAL LOW (ref 35.0–47.0)
HEMATOCRIT: 31.9 % — AB (ref 35.0–47.0)
Hemoglobin: 10.9 g/dL — ABNORMAL LOW (ref 12.0–16.0)
Hemoglobin: 11.1 g/dL — ABNORMAL LOW (ref 12.0–16.0)
MCH: 33.2 pg (ref 26.0–34.0)
MCH: 32.4 pg (ref 26.0–34.0)
MCHC: 34 g/dL (ref 32.0–36.0)
MCHC: 33.7 g/dL (ref 32.0–36.0)
MCV: 97.7 fL (ref 80.0–100.0)
MCV: 96.3 fL (ref 80.0–100.0)
PLATELETS: 134 10*3/uL — AB (ref 150–440)
PLATELETS: 135 10*3/uL — AB (ref 150–440)
RBC: 3.27 MIL/uL — AB (ref 3.80–5.20)
RBC: 3.43 MIL/uL — AB (ref 3.80–5.20)
RDW: 14.3 % (ref 11.5–14.5)
RDW: 14.5 % (ref 11.5–14.5)
WBC: 7.5 10*3/uL (ref 3.6–11.0)
WBC: 6.6 10*3/uL (ref 3.6–11.0)

## 2016-05-11 LAB — ECHOCARDIOGRAM COMPLETE
HEIGHTINCHES: 64 in
WEIGHTICAEL: 2691.2 [oz_av]

## 2016-05-11 MED ORDER — AZITHROMYCIN 500 MG PO TABS: 500.0000 mg | ORAL_TABLET | Freq: Every day | ORAL | 0 refills | Status: AC

## 2016-05-11 MED ORDER — ONDANSETRON HCL 4 MG/2ML IJ SOLN: 4.0000 mg | Freq: Four times a day (QID) | INTRAMUSCULAR | Status: DC | PRN

## 2016-05-11 MED ORDER — ONDANSETRON HCL 4 MG PO TABS: 4.0000 mg | ORAL_TABLET | Freq: Four times a day (QID) | ORAL | Status: DC | PRN

## 2016-05-11 MED ORDER — IBUPROFEN 400 MG PO TABS
600.0000 mg | ORAL_TABLET | Freq: Four times a day (QID) | ORAL | Status: DC | PRN
  Administered 2016-05-11 (×3): 600 mg via ORAL
  Filled 2016-05-11 (×3): qty 2

## 2016-05-11 MED ORDER — ACETAMINOPHEN 325 MG PO TABS
650.0000 mg | ORAL_TABLET | Freq: Four times a day (QID) | ORAL | Status: DC | PRN
Start: 2016-05-11 — End: 2016-05-11

## 2016-05-11 MED ORDER — ACETAMINOPHEN 650 MG RE SUPP: 650.0000 mg | Freq: Four times a day (QID) | RECTAL | Status: DC | PRN

## 2016-05-11 MED ORDER — DEXTROSE 5 % IV SOLN
500.0000 mg | INTRAVENOUS | Status: DC
  Filled 2016-05-11: qty 500

## 2016-05-11 MED ORDER — AZITHROMYCIN 250 MG PO TABS: 500.0000 mg | ORAL_TABLET | Freq: Every day | ORAL | Status: DC

## 2016-05-11 MED ORDER — DEXTROSE 5 % IV SOLN
1.0000 g | INTRAVENOUS | Status: DC
  Filled 2016-05-11: qty 10

## 2016-05-11 MED ORDER — SODIUM CHLORIDE 0.9% FLUSH
3.0000 mL | Freq: Two times a day (BID) | INTRAVENOUS | Status: DC
  Administered 2016-05-11 (×2): 3 mL via INTRAVENOUS

## 2016-05-11 MED ORDER — ENOXAPARIN SODIUM 40 MG/0.4ML ~~LOC~~ SOLN
40.0000 mg | Freq: Every day | SUBCUTANEOUS | Status: DC
  Administered 2016-05-11: 40 mg via SUBCUTANEOUS
  Filled 2016-05-11: qty 0.4

## 2016-05-11 MED ORDER — FAMOTIDINE 20 MG PO TABS
20.0000 mg | ORAL_TABLET | Freq: Two times a day (BID) | ORAL | Status: DC
  Administered 2016-05-11: 20 mg via ORAL
  Filled 2016-05-11: qty 1

## 2016-05-11 NOTE — Care Management (Signed)
Echocardiogram results are pending to rule out cardiomyopathy

## 2016-05-11 NOTE — Progress Notes (Signed)
PHARMACIST - PHYSICIAN COMMUNICATION DR:   [place] CONCERNING: Antibiotic IV to Oral Route Change Policy  RECOMMENDATION: This patient is receiving Azithromycin by the intravenous route.  Based on criteria approved by the Pharmacy and Therapeutics Committee, the antibiotic(s) is/are being converted to the equivalent oral dose form(s).   DESCRIPTION: These criteria include:  Patient being treated for a respiratory tract infection, urinary tract infection, cellulitis or clostridium difficile associated diarrhea if on metronidazole  The patient is not neutropenic and does not exhibit a GI malabsorption state  The patient is eating (either orally or via tube) and/or has been taking other orally administered medications for a least 24 hours  The patient is improving clinically and has a Tmax < 100.5  If you have questions about this conversion, please contact the Pharmacy Department  []  ( [phone] )  [place] [x]  ( [phone] )  [facility] []  ( [phone]  [place] []  ( [phone] )  [facility] []  ( [phone] )  [facility]     Hayley Kirk D. Anila Bojarski, PharmD  

## 2016-05-11 NOTE — H&P (Signed)
[facility] - [place] at [facility]   PATIENT NAME: Hayley Kirk    MR#:  [id number]  DATE OF BIRTH:  1986/05/10  DATE OF ADMISSION:  05/10/2016  PRIMARY CARE PHYSICIAN: No PCP Per Patient   REQUESTING/REFERRING PHYSICIAN: Inocencio Homes, MD  CHIEF COMPLAINT:   Chief Complaint  Patient presents with  . Abdominal Pain    HISTORY OF PRESENT ILLNESS:  Hayley Kirk  is a 30 y.o. female who presents with Pleuritic chest pain with some intermittent shortness of breath and leg swelling. Patient just delivered her baby couple of days ago. She had a vaginal delivery without complication. However, she states that her chest pain started about 6 days ago, prior to her delivery. She is relatively poor historian and unable to give much more detail. Abdomen imaging workup tonight in the ED is indeterminate. She has mildly elevated BNP, normal troponin, labs are otherwise largely unremarkable except for possible UTI on UA. Imaging shows no PE on CTA chest, but she does have bilateral groundglass opacities potentially consistent with heart failure or aspiration. Hospitals were called for admission and further evaluation  PAST MEDICAL HISTORY:   Past Medical History:  Diagnosis Date  . Migraines    hx of migraines    PAST SURGICAL HISTORY:   Past Surgical History:  Procedure Laterality Date  . DILATION AND CURETTAGE OF UTERUS      SOCIAL HISTORY:   Social History  Substance Use Topics  . Smoking status: Never Smoker  . Smokeless tobacco: Never Used  . Alcohol use No    FAMILY HISTORY:   Family History  Problem Relation Age of Onset  . Breast cancer Mother     stage 4  . Stomach cancer Mother   . Diabetes Mother   . Hyperlipidemia Mother   . Hypertension Mother   . Stroke Mother   . Heart failure Father   . Breast cancer Maternal Aunt     x2  . Stomach cancer Maternal Aunt     passed away 25yrs ago  . Migraines Sister     DRUG ALLERGIES:  No Known  Allergies  MEDICATIONS AT HOME:   Prior to Admission medications   Medication Sig Start Date End Date Taking? Authorizing Provider  ibuprofen (ADVIL,MOTRIN) 200 MG tablet Take 600 mg by mouth every 6 (six) hours as needed for mild pain or moderate pain.   Yes Historical Provider, MD  Prenatal Vit-Fe Fumarate-FA (PRENATAL MULTIVITAMIN) TABS tablet Take 1 tablet by mouth daily at 12 noon.   Yes Historical Provider, MD    REVIEW OF SYSTEMS:  Review of Systems  Constitutional: Negative for chills, fever, malaise/fatigue and weight loss.  HENT: Negative for ear pain, hearing loss and tinnitus.   Eyes: Negative for blurred vision, double vision, pain and redness.  Respiratory: Positive for shortness of breath. Negative for cough and hemoptysis.   Cardiovascular: Positive for chest pain and leg swelling. Negative for palpitations and orthopnea.  Gastrointestinal: Negative for abdominal pain, constipation, diarrhea, nausea and vomiting.  Genitourinary: Negative for dysuria, frequency and hematuria.  Musculoskeletal: Negative for back pain, joint pain and neck pain.  Skin:       No acne, rash, or lesions  Neurological: Negative for dizziness, tremors, focal weakness and weakness.  Endo/Heme/Allergies: Negative for polydipsia. Does not bruise/bleed easily.  Psychiatric/Behavioral: Negative for depression. The patient is not nervous/anxious and does not have insomnia.      VITAL SIGNS:   Vitals:   05/10/16 2230 05/10/16  2251 05/10/16 2337 05/11/16 0000  BP: 130/77  114/73 118/86  Pulse:  (!) 48 (!) 47 (!) 47  Resp:  19 15 14   Temp:      TempSrc:      SpO2:  99% 99% 96%  Weight:      Height:       Wt Readings from Last 3 Encounters:  05/10/16 79.4 kg (175 lb)  05/07/16 78.5 kg (173 lb)  05/06/16 78.5 kg (173 lb 1.6 oz)    PHYSICAL EXAMINATION:  Physical Exam  Vitals reviewed. Constitutional: She is oriented to person, place, and time. She appears well-developed and  well-nourished. No distress.  HENT:  Head: Normocephalic and atraumatic.  Mouth/Throat: Oropharynx is clear and moist.  Eyes: Conjunctivae and EOM are normal. Pupils are equal, round, and reactive to light. No scleral icterus.  Neck: Normal range of motion. Neck supple. No JVD present. No thyromegaly present.  Cardiovascular: Normal rate, regular rhythm and intact distal pulses.  Exam reveals no gallop and no friction rub.   Murmur (2/6 systolic murmur) heard. Respiratory: Effort normal and breath sounds normal. No respiratory distress. She has no wheezes. She has no rales.  GI: Soft. Bowel sounds are normal. She exhibits no distension. There is no tenderness.  Musculoskeletal: Normal range of motion. She exhibits edema (1+ bilateral lower extremity).  No arthritis, no gout  Lymphadenopathy:    She has no cervical adenopathy.  Neurological: She is alert and oriented to person, place, and time. No cranial nerve deficit.  No dysarthria, no aphasia  Skin: Skin is warm and dry. No rash noted. No erythema.  Psychiatric: She has a normal mood and affect. Her behavior is normal. Judgment and thought content normal.    LABORATORY PANEL:   CBC  Recent Labs Lab 05/10/16 2052  WBC 7.9  HGB 11.3*  HCT 33.0*  PLT 147*   ------------------------------------------------------------------------------------------------------------------  Chemistries   Recent Labs Lab 05/10/16 2052  NA 137  K 3.6  CL 107  CO2 24  GLUCOSE 93  BUN 10  CREATININE 0.59  CALCIUM 9.0  AST 41  ALT 30  ALKPHOS 98  BILITOT 0.4   ------------------------------------------------------------------------------------------------------------------  Cardiac Enzymes  Recent Labs Lab 05/10/16 2052  TROPONINI <0.03   ------------------------------------------------------------------------------------------------------------------  RADIOLOGY:  Dg Chest 2 View  Result Date: 05/10/2016 CLINICAL DATA:   Shortness of breath, chest pain, BILATERAL feet swelling and LEFT lower abdominal pain today, 2 days post partum, cough for 2 weeks EXAM: CHEST  2 VIEW COMPARISON:  07/30/2012 FINDINGS: Normal heart size, mediastinal contours, and pulmonary vascularity. Lungs clear. No pleural effusion or pneumothorax. Bones unremarkable. IMPRESSION: Normal exam. Electronically Signed   By: Ulyses Southward M.D.   On: 05/10/2016 21:16   Ct Angio Chest Pe W And/or Wo Contrast  Result Date: 05/10/2016 CLINICAL DATA:  Pleuritic chest pain, 2 days postpartum. EXAM: CT ANGIOGRAPHY CHEST WITH CONTRAST TECHNIQUE: Multidetector CT imaging of the chest was performed using the standard protocol during bolus administration of intravenous contrast. Multiplanar CT image reconstructions and MIPs were obtained to evaluate the vascular anatomy. CONTRAST:  100 cc Isovue 370 IV COMPARISON:  Chest radiographs earlier this day FINDINGS: Cardiovascular: There are no filling defects within the pulmonary arteries to suggest pulmonary embolus. Thoracic aorta is normal in caliber. No evidence for dissection allowing for phase of contrast. Mediastinum/Nodes: Soft tissue density anterior mediastinum likely recurrent thymus. No adenopathy. The esophagus is decompressed. No pericardial fluid. Lungs/Pleura: Faint ground-glass and reticulonodular opacities in the dependent  right upper lobe. Minimal subsegmental atelectasis in the lingula. Lungs are otherwise clear. No confluent airspace disease. No pleural fluid. Trachea and mainstem bronchi are patent. Upper Abdomen: No acute abnormality. Musculoskeletal: There are no acute or suspicious osseous abnormalities. Review of the MIP images confirms the above findings. IMPRESSION: 1. No pulmonary embolus. 2. Faint ground-glass and reticulonodular opacities in the right upper lobe, likely infectious or inflammatory. Minimal aspiration could have this appearance. Electronically Signed   By: Rubye Oaks M.D.   On:  05/10/2016 22:40    EKG:   Orders placed or performed in visit on 05/03/14  . EKG 12-Lead    IMPRESSION AND PLAN:  Principal Problem:   Pleuritic chest pain - unclear etiology. She was started on IV antibiotics for possible aspiration, continue these on admission, though there is lower suspicion for the same given her age and normal white count. Suspect possible peri or postpartum cardiomyopathy. Echocardiogram ordered. Active Problems:   UTI (lower urinary tract infection) - IV antibiotics as above, urine culture sent   Postpartum state - seems to be recovering well from her delivery, states she is only having some cramping, bleeding is much reduced   Bilateral leg edema - possibly related to cardiomyopathy as above, workup as above  All the records are reviewed and case discussed with ED provider. Management plans discussed with the patient and/or family.  DVT PROPHYLAXIS: SubQ lovenox  GI PROPHYLAXIS: None  ADMISSION STATUS: Inpatient  CODE STATUS: Full Code Status History    Date Active Date Inactive Code Status Order ID Comments User Context   05/08/2016  5:00 AM 05/09/2016  8:01 PM Full Code [id number]  Purcell Nails, CNM Inpatient   05/07/2016  2:09 PM 05/07/2016  3:36 PM Full Code [id number]  Melody Suzan Nailer, CNM Inpatient      TOTAL TIME TAKING CARE OF THIS PATIENT: 45 minutes.    Leronda Lewers FIELDING 05/11/2016, 1:04 AM  [facility]  Office  [phone]  CC: Primary care physician; No PCP Per Patient

## 2016-05-11 NOTE — Progress Notes (Signed)
Pt discharged to home via wc.  Instructions and rx given to pt.  Questions answered.  No distress.  

## 2016-05-11 NOTE — Progress Notes (Signed)
Pharmacy Antibiotic Note  Hayley Kirk is a 30 y.o. female admitted on 05/10/2016 with UTI.  Pharmacy has been consulted for ceftriaxone dosing.  Plan: Ceftriaxone 1 gm IV Q24H  Height: 5\' 4"  (162.6 cm) Weight: 175 lb (79.4 kg) IBW/kg (Calculated) : 54.7  Temp (24hrs), Avg:98 F (36.7 C), Min:97.7 F (36.5 C), Max:98.2 F (36.8 C)   Recent Labs Lab 05/07/16 1502 05/08/16 0606 05/10/16 2052  WBC 7.2 9.5 7.9  CREATININE  --   --  0.59    Estimated Creatinine Clearance: 106.8 mL/min (by C-G formula based on SCr of 0.8 mg/dL).    No Known Allergies   Thank you for allowing pharmacy to be a part of this patient's care.  Carola Frost, Pharm.D., BCPS Clinical Pharmacist 05/11/2016 2:01 AM

## 2016-05-11 NOTE — Progress Notes (Signed)
*  PRELIMINARY RESULTS* Echocardiogram 2D Echocardiogram has been performed.  Hayley Kirk 05/11/2016, 3:26 PM

## 2016-05-11 NOTE — Discharge Instructions (Signed)
Resume regular diet and activity °

## 2016-05-12 LAB — URINE CULTURE

## 2016-05-13 NOTE — Discharge Summary (Signed)
[facility] -  [place] at [facility]   PATIENT NAME: Hayley Kirk    MR#:  [id number]  DATE OF BIRTH:  1986-09-01  DATE OF ADMISSION:  05/10/2016 ADMITTING PHYSICIAN: Oralia Manis, MD  DATE OF DISCHARGE: 05/11/2016  5:04 PM  PRIMARY CARE PHYSICIAN: No PCP Per Patient   ADMISSION DIAGNOSIS:  Leg swelling [M79.89] Chest pain, unspecified chest pain type [R07.9] Aspiration pneumonia, unspecified aspiration pneumonia type, unspecified laterality, unspecified part of lung (HCC) [J69.0]  DISCHARGE DIAGNOSIS:  Principal Problem:   Pleuritic chest pain Active Problems:   UTI (lower urinary tract infection)   Bilateral leg edema   Postpartum state   SECONDARY DIAGNOSIS:   Past Medical History:  Diagnosis Date  . Migraines    hx of migraines     ADMITTING HISTORY  Hayley Kirk  is a 30 y.o. female who presents with Pleuritic chest pain with some intermittent shortness of breath and leg swelling. Patient just delivered her baby couple of days ago. She had a vaginal delivery without complication. However, she states that her chest pain started about 6 days ago, prior to her delivery. She is relatively poor historian and unable to give much more detail. Abdomen imaging workup tonight in the ED is indeterminate. She has mildly elevated BNP, normal troponin, labs are otherwise largely unremarkable except for possible UTI on UA. Imaging shows no PE on CTA chest, but she does have bilateral groundglass opacities potentially consistent with heart failure or aspiration. Hospitals were called for admission and further evaluation  HOSPITAL COURSE:   * Patient was admitted onto telemetry floor due to concern for postpartum cardiomyopathy. She had no signs of fluid overload. CT scan of the chest showed no pulmonary embolism. Raised concern for right-sided pneumonia for which she was started on azithromycin. Patient's chest pain slowly improved during her short stay in the  hospital. Echocardiogram showed normal ejection fraction and patient is being discharged home on azithromycin to follow-up with her primary care physician in a week.  Her chest pain is due to pneumonia.  CONSULTS OBTAINED:    DRUG ALLERGIES:  No Known Allergies  DISCHARGE MEDICATIONS:   Discharge Medication List as of 05/11/2016  4:43 PM    START taking these medications   Details  azithromycin (ZITHROMAX) 500 MG tablet Take 1 tablet (500 mg total) by mouth daily., Starting Wed 05/11/2016, Until Mon 05/16/2016, Print      CONTINUE these medications which have NOT CHANGED   Details  ibuprofen (ADVIL,MOTRIN) 200 MG tablet Take 600 mg by mouth every 6 (six) hours as needed for mild pain or moderate pain., Historical Med    Prenatal Vit-Fe Fumarate-FA (PRENATAL MULTIVITAMIN) TABS tablet Take 1 tablet by mouth daily at 12 noon., Until Discontinued, Historical Med        Today   VITAL SIGNS:  Blood pressure 125/63, pulse 61, temperature 98.5 F (36.9 C), temperature source Oral, resp. rate 16, height  (1.626 m), weight 76.3 kg (168 lb 3.2 oz), SpO2 96 %, unknown if currently breastfeeding.  I/O:  No intake or output data in the 24 hours ending 05/13/16 1033  PHYSICAL EXAMINATION:  Physical Exam  GENERAL:  30 y.o.-year-old patient lying in the bed with no acute distress.  LUNGS: Normal breath sounds bilaterally, no wheezing, rales,rhonchi or crepitation. No use of accessory muscles of respiration.  CARDIOVASCULAR: S1, S2 normal. No murmurs, rubs, or gallops.  ABDOMEN: Soft, non-tender, non-distended. Bowel sounds present. No organomegaly or mass.  NEUROLOGIC: Moves  all 4 extremities. PSYCHIATRIC: The patient is alert and oriented x 3.  SKIN: No obvious rash, lesion, or ulcer.   DATA REVIEW:   CBC  Recent Labs Lab 05/11/16 0753  WBC 6.6  HGB 11.1*  HCT 33.0*  PLT 135*    Chemistries   Recent Labs Lab 05/10/16 2052  05/11/16 0753  NA 137  --  139  K 3.6  --   3.9  CL 107  --  109  CO2 24  --  24  GLUCOSE 93  --  85  BUN 10  --  8  CREATININE 0.59  < > 0.64  CALCIUM 9.0  --  8.5*  AST 41  --   --   ALT 30  --   --   ALKPHOS 98  --   --   BILITOT 0.4  --   --   < > = values in this interval not displayed.  Cardiac Enzymes  Recent Labs Lab 05/11/16 1359  TROPONINI <0.03    Microbiology Results  Results for orders placed or performed during the hospital encounter of 05/10/16  Urine culture     Status: Abnormal   Collection Time: 05/10/16  8:52 PM  Result Value Ref Range Status   Specimen Description URINE, RANDOM  Final   Special Requests NONE  Final   Culture MULTIPLE SPECIES PRESENT, SUGGEST RECOLLECTION (A)  Final   Report Status 05/12/2016 FINAL  Final  Blood culture (routine x 2)     Status: None (Preliminary result)   Collection Time: 05/10/16 11:42 PM  Result Value Ref Range Status   Specimen Description BLOOD RIGHT FATTY CASTS  Final   Special Requests BOTTLES DRAWN AEROBIC AND ANAEROBIC 10CC  Final   Culture NO GROWTH 2 DAYS  Final   Report Status PENDING  Incomplete  Blood culture (routine x 2)     Status: None (Preliminary result)   Collection Time: 05/10/16 11:42 PM  Result Value Ref Range Status   Specimen Description BLOOD RIGHT ASSIST CONTROL  Final   Special Requests BOTTLES DRAWN AEROBIC AND ANAEROBIC 10CC  Final   Culture NO GROWTH 2 DAYS  Final   Report Status PENDING  Incomplete    RADIOLOGY:  No results found.  Follow up with PCP in 1 week.  Management plans discussed with the patient, family and they are in agreement.  CODE STATUS:  Code Status History    Date Active Date Inactive Code Status Order ID Comments User Context   05/11/2016  1:58 AM 05/11/2016  1:08 PM Full Code [id number]  Oralia Manis, MD Inpatient   05/08/2016  5:00 AM 05/09/2016  8:01 PM Full Code [id number]  Purcell Nails, CNM Inpatient   05/07/2016  2:09 PM 05/07/2016  3:36 PM Full Code [id number]  Melody Suzan Nailer, CNM Inpatient       TOTAL TIME TAKING CARE OF THIS PATIENT ON DAY OF DISCHARGE: more than 30 minutes.   Milagros Loll R M.D on 05/13/2016 at 10:33 AM  Between 7am to 6pm - Pager - [phone]  After 6pm go to [email or web address] - password [email or web address] [facility]  [facility]  Office  [phone]  CC: Primary care physician; No PCP Per Patient  Note: This dictation was prepared with Dragon dictation along with smaller phrase technology. Any transcriptional errors that result from this process are unintentional.

## 2016-05-16 ENCOUNTER — Emergency Department
Admission: EM | Admit: 2016-05-16 | Discharge: 2016-05-16 | Disposition: A | Discharge status: Home / Self Care | Payer: Medicaid Other | Attending: Emergency Medicine | Admitting: Emergency Medicine

## 2016-05-16 ENCOUNTER — Encounter: Payer: Self-pay | Admitting: Emergency Medicine

## 2016-05-16 DIAGNOSIS — M542 Cervicalgia: Secondary | ICD-10-CM | POA: Diagnosis present

## 2016-05-16 DIAGNOSIS — M436 Torticollis: Secondary | ICD-10-CM | POA: Diagnosis not present

## 2016-05-16 MED ORDER — CYCLOBENZAPRINE HCL 10 MG PO TABS
5.0000 mg | ORAL_TABLET | Freq: Once | ORAL | Status: AC
  Administered 2016-05-16: 5 mg via ORAL
  Filled 2016-05-16: qty 1

## 2016-05-16 MED ORDER — KETOROLAC TROMETHAMINE 60 MG/2ML IM SOLN
30.0000 mg | Freq: Once | INTRAMUSCULAR | Status: AC
  Administered 2016-05-16: 30 mg via INTRAMUSCULAR
  Filled 2016-05-16: qty 2

## 2016-05-16 MED ORDER — KETOROLAC TROMETHAMINE 10 MG PO TABS: 10.0000 mg | ORAL_TABLET | Freq: Three times a day (TID) | ORAL | 0 refills | Status: DC

## 2016-05-16 MED ORDER — CYCLOBENZAPRINE HCL 5 MG PO TABS: 5.0000 mg | ORAL_TABLET | Freq: Three times a day (TID) | ORAL | 0 refills | Status: DC | PRN

## 2016-05-16 NOTE — Discharge Instructions (Signed)
Take the prescription meds as directed.  Apply warm compresses or cool compresses to the neck for pain relief. Follow-up with your provider as needed.

## 2016-05-16 NOTE — ED Provider Notes (Signed)
[facility] Emergency Department Provider Note ____________________________________________  Time seen: 10:54 PM  I have reviewed the triage vital signs and the nursing notes.  HISTORY  Chief Complaint  Neck Pain  HPI Hayley Kirk is a 30 y.o. female presents to the ED for evaluation of onset of left neck pain 2 days prior. The patient denies any injury, accident, trauma. She is weeks' postpartum from a normal vaginal delivery. She is currently not breast-feeding and not pumping for her newborn. She describes pain to the left portion of the lateral neck from behind the ear to the top of the collarbone. She reports some intermittent spasm and notes pain with range of motion. She denies any distal paresthesias, grip changes, or headache. She is dosed ibuprofen without significant benefit to her symptoms.  Past Medical History:  Diagnosis Date  . Migraines    hx of migraines    Patient Active Problem List   Diagnosis Date Noted  . Pleuritic chest pain 05/11/2016  . UTI (lower urinary tract infection) 05/11/2016  . Bilateral leg edema 05/11/2016  . Postpartum state 05/11/2016  . Labor and delivery, indication for care 05/07/2016  . Family history of breast cancer in mother 10/29/2015  . Rubella non-immune status, antepartum 09/21/2015  . History of preterm delivery, currently pregnant 09/21/2015  . Susceptible to Varicella (non-immune), currently pregnant in first trimester 09/21/2015    Past Surgical History:  Procedure Laterality Date  . DILATION AND CURETTAGE OF UTERUS      Prior to Admission medications   Medication Sig Start Date End Date Taking? Authorizing Provider  azithromycin (ZITHROMAX) 500 MG tablet Take 1 tablet (500 mg total) by mouth daily. 05/11/16 05/16/16  Milagros Loll, MD  cyclobenzaprine (FLEXERIL) 5 MG tablet Take 1 tablet (5 mg total) by mouth 3 (three) times daily as needed for muscle spasms. 05/16/16   Annye Forrey V Bacon Aayla Marrocco, PA-C   ibuprofen (ADVIL,MOTRIN) 200 MG tablet Take 600 mg by mouth every 6 (six) hours as needed for mild pain or moderate pain.    Historical Provider, MD  ketorolac (TORADOL) 10 MG tablet Take 1 tablet (10 mg total) by mouth every 8 (eight) hours. 05/16/16   Devin Ganaway V Bacon Missouri Lapaglia, PA-C  Prenatal Vit-Fe Fumarate-FA (PRENATAL MULTIVITAMIN) TABS tablet Take 1 tablet by mouth daily at 12 noon.    Historical Provider, MD    Allergies Review of patient's allergies indicates no known allergies.  Family History  Problem Relation Age of Onset  . Breast cancer Mother     stage 4  . Stomach cancer Mother   . Diabetes Mother   . Hyperlipidemia Mother   . Hypertension Mother   . Stroke Mother   . Heart failure Father   . Breast cancer Maternal Aunt     x2  . Stomach cancer Maternal Aunt     passed away 66yrs ago  . Migraines Sister     Social History Social History  Substance Use Topics  . Smoking status: Never Smoker  . Smokeless tobacco: Never Used  . Alcohol use No    Review of Systems  Constitutional: Negative for fever. Eyes: Negative for visual changes. ENT: Negative for sore throat. Musculoskeletal: Negative for back pain.Neck pain as above. Skin: Negative for rash. Neurological: Negative for headaches, focal weakness or numbness. ____________________________________________  PHYSICAL EXAM:  VITAL SIGNS: ED Triage Vitals  Enc Vitals Group     BP 05/16/16 2151 (!) 131/95     Pulse Rate 05/16/16 2151  78     Resp 05/16/16 2151 18     Temp 05/16/16 2151 98.1 F (36.7 C)     Temp Source 05/16/16 2151 Oral     SpO2 05/16/16 2151 98 %     Weight 05/16/16 2151 145 lb (65.8 kg)     Height 05/16/16 2151 5\' 4"  (1.626 m)     Head Circumference --      Peak Flow --      Pain Score 05/16/16 2150 10     Pain Loc --      Pain Edu? --      Excl. in GC? --    Constitutional: Alert and oriented. Well appearing and in no distress. Head: Normocephalic and atraumatic.      Eyes:  Conjunctivae are normal. PERRL. Normal extraocular movements      Ears: Canals clear. TMs intact bilaterally.   Nose: No congestion/rhinorrhea.   Mouth/Throat: Mucous membranes are moist.   Neck: Supple. No thyromegaly. Mild tenderness to palpation over the left SCM muscle. Patient with normal range of motion without crepitus to the neck. Hematological/Lymphatic/Immunological: No cervical lymphadenopathy. Cardiovascular: Normal rate, regular rhythm.  Respiratory: Normal respiratory effort. No wheezes/rales/rhonchi. Musculoskeletal: Nontender with normal range of motion in all extremities.  Neurologic: Cranial nerves II through XII grossly intact. Normal UE DTRs bilaterally. Normal grip strength bilaterally. Normal speech and language. No gross focal neurologic deficits are appreciated. ____________________________________________  PROCEDURES  Toradol 30 mg IM Flexeril 5 mg PO ____________________________________________  INITIAL IMPRESSION / ASSESSMENT AND PLAN / ED COURSE  Patient with a clinical presentation consistent with an acute torticollis. She is discharged with prescriptions for ketorolac and Flexeril dose as directed. She is advised to apply warm compresses and cool compresses to the neck for comfort. [facility] her primary care provider for ongoing symptom management.  Clinical Course   ____________________________________________  FINAL CLINICAL IMPRESSION(S) / ED DIAGNOSES  Final diagnoses:  Torticollis, acute      Lissa HoardJenise V Bacon Gerene Nedd, PA-C 05/16/16 2340    Myrna Blazeravid Matthew Schaevitz, MD 05/19/16 [phone]

## 2016-05-16 NOTE — ED Notes (Signed)
Pt states she had her baby 2 weeks in which she had an epidural; bilateral neck pain since Wednesday. Denies injuries.

## 2016-05-16 NOTE — ED Triage Notes (Signed)
Patient ambulatory to triage with steady gait, without difficulty or distress noted; pt reports epidural with delivery 2weeks ago; noted right sided neck 3 days ago; denies any accomp symptoms; st pain increases with turning head side-to-side

## 2016-05-25 LAB — CULTURE, BLOOD (ROUTINE X 2)
CULTURE: NO GROWTH
CULTURE: NO GROWTH

## 2016-06-24 ENCOUNTER — Ambulatory Visit: Payer: Medicaid Other | Admitting: Obstetrics and Gynecology

## 2016-11-25 ENCOUNTER — Emergency Department
Admission: EM | Admit: 2016-11-25 | Discharge: 2016-11-26 | Disposition: A | Discharge status: Home / Self Care | Payer: Self-pay | Attending: Emergency Medicine | Admitting: Emergency Medicine

## 2016-11-25 ENCOUNTER — Encounter: Payer: Self-pay | Admitting: Emergency Medicine

## 2016-11-25 DIAGNOSIS — K5900 Constipation, unspecified: Secondary | ICD-10-CM | POA: Insufficient documentation

## 2016-11-25 DIAGNOSIS — R1030 Lower abdominal pain, unspecified: Secondary | ICD-10-CM

## 2016-11-25 DIAGNOSIS — R110 Nausea: Secondary | ICD-10-CM | POA: Insufficient documentation

## 2016-11-25 LAB — POCT PREGNANCY, URINE: Preg Test, Ur: NEGATIVE

## 2016-11-25 NOTE — ED Provider Notes (Signed)
[facility] Emergency Department Provider Note  ____________________________________________   None    (approximate)  I have reviewed the triage vital signs and the nursing notes.   HISTORY  Chief Complaint Constipation    HPI LEYDA VANDERWERF is a 31 y.o. female with no chronic medical issues who presents for evaluation of gradual onset abdominal pain, decreased appetite, nausea, and constipation over the last 4-5 days.She reports that for the last several days she has not been interested in eating and has forgotten to eat meals.  She has not had a bowel movement for a couple days.  She says that she has had mild to moderate nausea comes and goes.  She has not had any vomiting.  She states that her pain is severe at times but then decreases to mild.  It is mostly an aching pain but is occasionally sharp.  Nothing in particular makes it better and pushing on her abdomen makes it worse.  She denies dysuria, fever/chills, chest pain, shortness of breath.  Her menstrual period ended today.  Past Medical History:  Diagnosis Date  . Migraines    hx of migraines    Patient Active Problem List   Diagnosis Date Noted  . Pleuritic chest pain 05/11/2016  . UTI (lower urinary tract infection) 05/11/2016  . Bilateral leg edema 05/11/2016  . Postpartum state 05/11/2016  . Labor and delivery, indication for care 05/07/2016  . Family history of breast cancer in mother 10/29/2015  . Rubella non-immune status, antepartum 09/21/2015  . History of preterm delivery, currently pregnant 09/21/2015  . Susceptible to Varicella (non-immune), currently pregnant in first trimester 09/21/2015    Past Surgical History:  Procedure Laterality Date  . DILATION AND CURETTAGE OF UTERUS      Prior to Admission medications   Medication Sig Start Date End Date Taking? Authorizing Provider  cyclobenzaprine (FLEXERIL) 5 MG tablet Take 1 tablet (5 mg total) by mouth 3 (three) times  daily as needed for muscle spasms. 05/16/16   Jenise V Bacon Menshew, PA-C  ibuprofen (ADVIL,MOTRIN) 200 MG tablet Take 600 mg by mouth every 6 (six) hours as needed for mild pain or moderate pain.    Historical Provider, MD  ketorolac (TORADOL) 10 MG tablet Take 1 tablet (10 mg total) by mouth every 8 (eight) hours. 05/16/16   Jenise V Bacon Menshew, PA-C  Prenatal Vit-Fe Fumarate-FA (PRENATAL MULTIVITAMIN) TABS tablet Take 1 tablet by mouth daily at 12 noon.    Historical Provider, MD    Allergies Patient has no known allergies.  Family History  Problem Relation Age of Onset  . Breast cancer Mother     stage 4  . Stomach cancer Mother   . Diabetes Mother   . Hyperlipidemia Mother   . Hypertension Mother   . Stroke Mother   . Heart failure Father   . Breast cancer Maternal Aunt     x2  . Stomach cancer Maternal Aunt     passed away 76yrs ago  . Migraines Sister     Social History Social History  Substance Use Topics  . Smoking status: Never Smoker  . Smokeless tobacco: Never Used  . Alcohol use No    Review of Systems Constitutional: No fever/chills Eyes: No visual changes. ENT: No sore throat. Cardiovascular: Denies chest pain. Respiratory: Denies shortness of breath. Gastrointestinal: RLQ pain radiating to back.  Nausea, not vomiting.  Constipation for 4-5 days.  Anorexia for several days. Genitourinary: Negative for dysuria. Musculoskeletal:  Negative for back pain. Skin: Negative for rash. Neurological: Negative for headaches, focal weakness or numbness.  10-point ROS otherwise negative.  ____________________________________________   PHYSICAL EXAM:  VITAL SIGNS: ED Triage Vitals [11/25/16 2243]  Enc Vitals Group     BP 123/82     Pulse Rate 87     Resp 16     Temp 98.1 F (36.7 C)     Temp Source Oral     SpO2 98 %     Weight 136 lb (61.7 kg)     Height 5\' 4"  (1.626 m)     Head Circumference      Peak Flow      Pain Score 8     Pain Loc      Pain  Edu?      Excl. in GC?     Constitutional: Alert and oriented. Well appearing and in no acute distress. Eyes: Conjunctivae are normal. PERRL. EOMI. Head: Atraumatic. Nose: No congestion/rhinnorhea. Mouth/Throat: Mucous membranes are moist. Neck: No stridor.  No meningeal signs.   Cardiovascular: Normal rate, regular rhythm. Good peripheral circulation. Grossly normal heart sounds. Respiratory: Normal respiratory effort.  No retractions. Lungs CTAB. Gastrointestinal: Soft.  Severe tenderness to palpation of the RLQ with rebound and guarding. GU:  deferred Musculoskeletal: No lower extremity tenderness nor edema. No gross deformities of extremities. Neurologic:  Normal speech and language. No gross focal neurologic deficits are appreciated.  Skin:  Skin is warm, dry and intact. No rash noted. Psychiatric: Mood and affect are normal. Speech and behavior are normal.  ____________________________________________   LABS (all labs ordered are listed, but only abnormal results are displayed)  Labs Reviewed  URINALYSIS, ROUTINE W REFLEX MICROSCOPIC - Abnormal; Notable for the following:       Result Value   Color, Urine YELLOW (*)    APPearance CLEAR (*)    Leukocytes, UA LARGE (*)    Bacteria, UA RARE (*)    Squamous Epithelial / LPF 6-30 (*)    All other components within normal limits  COMPREHENSIVE METABOLIC PANEL - Abnormal; Notable for the following:    ALT 12 (*)    All other components within normal limits  CBC WITH DIFFERENTIAL/PLATELET  LIPASE, BLOOD  POC URINE PREG, ED  POCT PREGNANCY, URINE   ____________________________________________  EKG  None - EKG not ordered by ED physician ____________________________________________  RADIOLOGY   Ct Abdomen Pelvis W Contrast  Result Date: 11/26/2016 CLINICAL DATA:  Constipation x5 days. Abdominal generalized abdominal pain. EXAM: CT ABDOMEN AND PELVIS WITH CONTRAST TECHNIQUE: Multidetector CT imaging of the abdomen and  pelvis was performed using the standard protocol following bolus administration of intravenous contrast. CONTRAST:  100mL ISOVUE-300 IOPAMIDOL (ISOVUE-300) INJECTION 61% COMPARISON:  Chest CT from 05/10/2016 FINDINGS: Lower chest: Normal sized cardiac chambers.  Clear lung bases. Hepatobiliary: Cholelithiasis without secondary signs of acute cholecystitis. No biliary mass or ductal dilatation. Pancreas: No peripancreatic inflammation. No focal mass or ductal dilatation. Spleen: No splenomegaly or mass. Adrenals/Urinary Tract: Within normal bilateral adrenal glands and kidneys without obstructive uropathy. Urinary bladder is nondistended. Stomach/Bowel: Nondistended stomach. Normal small bowel rotation. No significant small bowel dilatation or inflammation. Significant stool burden within large bowel to the proximal descending colon where there smooth transitioning to nondistended large bowel at this level. No bowel inflammation or thickening. Vascular/Lymphatic: No significant vascular findings are present. No enlarged abdominal or pelvic lymph nodes. Reproductive: Uterus and bilateral adnexa are unremarkable. Other: No abdominal wall hernia or abnormality. No abdominopelvic  ascites. Musculoskeletal: No acute or significant osseous findings. IMPRESSION: Increased colonic stool burden consistent with constipation. No small nor large bowel inflammation identified. Uncomplicated cholelithiasis. Electronically Signed   By: Tollie Eth M.D.   On: 11/26/2016 02:52    ____________________________________________   PROCEDURES  Procedure(s) performed:   Procedures   Critical Care performed: No ____________________________________________   INITIAL IMPRESSION / ASSESSMENT AND PLAN / ED COURSE  Pertinent labs & imaging results that were available during my care of the patient were reviewed by me and considered in my medical decision making (see chart for details).  Initially the chief complaint was for  constipation and I anticipated giving her a laxative.  However on physical exam she has exquisite tenderness to palpation of the right lower quadrant with rebound tenderness.  In spite of her normal vital signs I feel that we should evaluate with a CT scan of her abdomen and pelvis to rule out appendicitis.  Ovarian cyst I believe is less likely given that she is just finishing her menstrual cycle.  I explained to the patient the plan and she is in agreement.  Labs are pending.   Clinical Course as of Nov 26 316  Sat Nov 26, 2016  1610 CT scan notable only for constipation.  No evidence of any acute or emergent medical condition at this time.  I have treated the patient and gave my usual and customary management recommendations and return precautions.  She understands and agrees with the plan.  [CF]    Clinical Course User Index [CF] Loleta Rose, MD    ____________________________________________  FINAL CLINICAL IMPRESSION(S) / ED DIAGNOSES  Final diagnoses:  Lower abdominal pain  Constipation, unspecified constipation type     MEDICATIONS GIVEN DURING THIS VISIT:  Medications  iopamidol (ISOVUE-300) 61 % injection 30 mL (30 mLs Oral Contrast Given 11/26/16 0023)  iopamidol (ISOVUE-300) 61 % injection 100 mL (100 mLs Intravenous Contrast Given 11/26/16 0212)     NEW OUTPATIENT MEDICATIONS STARTED DURING THIS VISIT:  New Prescriptions   No medications on file    Modified Medications   No medications on file    Discontinued Medications   No medications on file     Note:  This document was prepared using Dragon voice recognition software and may include unintentional dictation errors.    Loleta Rose, MD 11/26/16 [phone]

## 2016-11-25 NOTE — ED Notes (Signed)
Pt presents to ED 17 c/o constipation x5 days, pt states last time passing gas was 4 days ago; pt also c/o headache x3 days; abdomen pain 8/10 generalized to the whole abdomen; pt states pain upon palpitation of the abdomen; abdomen is soft; pt denies any nausea or vomiting; pt is alert and oriented x4, able to speak in complete sentences; does not complain of shortness of breath, swelling, pt states feeling dizzy and lightheaded yesterday; pt states feeling stressed lately, and has only been eating once per day; normally pt states she eats 3 times per day plus snacks; pt reports urinating normally.

## 2016-11-25 NOTE — ED Triage Notes (Signed)
Pt ambulatory to triage in NAD, reports constipation x 3 days, report abd pain and back pain.

## 2016-11-26 ENCOUNTER — Emergency Department: Payer: Self-pay

## 2016-11-26 LAB — CBC WITH DIFFERENTIAL/PLATELET
BASOS ABS: 0 10*3/uL (ref 0–0.1)
BASOS PCT: 0 %
EOS ABS: 0.2 10*3/uL (ref 0–0.7)
Eosinophils Relative: 2 %
HEMATOCRIT: 38.1 % (ref 35.0–47.0)
Hemoglobin: 13.1 g/dL (ref 12.0–16.0)
Lymphocytes Relative: 18 %
Lymphs Abs: 1.3 10*3/uL (ref 1.0–3.6)
MCH: 32.9 pg (ref 26.0–34.0)
MCHC: 34.4 g/dL (ref 32.0–36.0)
MCV: 95.9 fL (ref 80.0–100.0)
MONO ABS: 0.4 10*3/uL (ref 0.2–0.9)
Monocytes Relative: 6 %
NEUTROS ABS: 5.3 10*3/uL (ref 1.4–6.5)
NEUTROS PCT: 74 %
Platelets: 186 10*3/uL (ref 150–440)
RBC: 3.97 MIL/uL (ref 3.80–5.20)
RDW: 12.9 % (ref 11.5–14.5)
WBC: 7.3 10*3/uL (ref 3.6–11.0)

## 2016-11-26 LAB — URINALYSIS, ROUTINE W REFLEX MICROSCOPIC
BILIRUBIN URINE: NEGATIVE
GLUCOSE, UA: NEGATIVE mg/dL
Hgb urine dipstick: NEGATIVE
KETONES UR: NEGATIVE mg/dL
Nitrite: NEGATIVE
PROTEIN: NEGATIVE mg/dL
Specific Gravity, Urine: 1.015 (ref 1.005–1.030)
pH: 6 (ref 5.0–8.0)

## 2016-11-26 LAB — COMPREHENSIVE METABOLIC PANEL
ALBUMIN: 4.2 g/dL (ref 3.5–5.0)
ALT: 12 U/L — ABNORMAL LOW (ref 14–54)
ANION GAP: 6 (ref 5–15)
AST: 19 U/L (ref 15–41)
Alkaline Phosphatase: 52 U/L (ref 38–126)
BILIRUBIN TOTAL: 1.2 mg/dL (ref 0.3–1.2)
BUN: 8 mg/dL (ref 6–20)
CHLORIDE: 105 mmol/L (ref 101–111)
CO2: 27 mmol/L (ref 22–32)
Calcium: 8.9 mg/dL (ref 8.9–10.3)
Creatinine, Ser: 0.76 mg/dL (ref 0.44–1.00)
GFR calc Af Amer: >60 mL/min (ref >60)
GFR calc non Af Amer: >60 mL/min (ref >60)
GLUCOSE: 98 mg/dL (ref 65–99)
POTASSIUM: 3.6 mmol/L (ref 3.5–5.1)
SODIUM: 138 mmol/L (ref 135–145)
TOTAL PROTEIN: 7.6 g/dL (ref 6.5–8.1)

## 2016-11-26 LAB — LIPASE, BLOOD: Lipase: 30 U/L (ref 11–51)

## 2016-11-26 MED ORDER — IOPAMIDOL (ISOVUE-300) INJECTION 61%
100.0000 mL | Freq: Once | INTRAVENOUS | Status: AC | PRN
  Administered 2016-11-26: 100 mL via INTRAVENOUS

## 2016-11-26 MED ORDER — IOPAMIDOL (ISOVUE-300) INJECTION 61%
30.0000 mL | Freq: Once | INTRAVENOUS | Status: AC
  Administered 2016-11-26: 30 mL via ORAL

## 2016-11-26 NOTE — Discharge Instructions (Signed)
You were seen in the emergency department today for constipation.  We recommend that you use one or more of the following over-the-counter medications in the order described: °  °1)  Colace (or Dulcolax) 100 mg:  This is a stool softener, and you may take it once or twice a day as needed. °2)  Senna tablets:  This is a bowel stimulant that will help "push" out your stool. It is the next step to add after you have tried a stool softener. °3)  Miralax (powder):  This medication works by drawing additional fluid into your intestines and helps to flush out your stool.  Mix the powder with water or juice according to label instructions.  It may help if the Colace and Senna are not sufficient, but you must be sure to use the recommended amount of water or juice when you mix up the powder. °Remember that narcotic pain medications are constipating, so avoid them or minimize their use.  Drink plenty of fluids. ° °Please return to the Emergency Department immediately if you develop new or worsening symptoms that concern you, such as (but not limited to) fever > 101 degrees, severe abdominal pain, or persistent vomiting. ° °

## 2016-11-26 NOTE — ED Notes (Signed)
Report from kenny, rn.  

## 2017-08-28 IMAGING — CT CT ANGIO CHEST
2 of 6 series · 19 of 46 positions shown · IV contrast (APPLIED)
Comparison: Chest radiographs earlier this day

CLINICAL DATA: Pleuritic chest pain, 2 days postpartum.

EXAM:
CT ANGIOGRAPHY CHEST WITH CONTRAST
TECHNIQUE: Multidetector CT imaging of the chest was performed using the
standard protocol during bolus administration of intravenous
contrast. Multiplanar CT image reconstructions and MIPs were
obtained to evaluate the vascular anatomy.
CONTRAST:  100 cc Isovue 370 IV

[Series 5: thins · axial · 0.65mm/px · z∈[+34,+271]mm · 16 of 261 slices shown]
[im 12/261  lung]
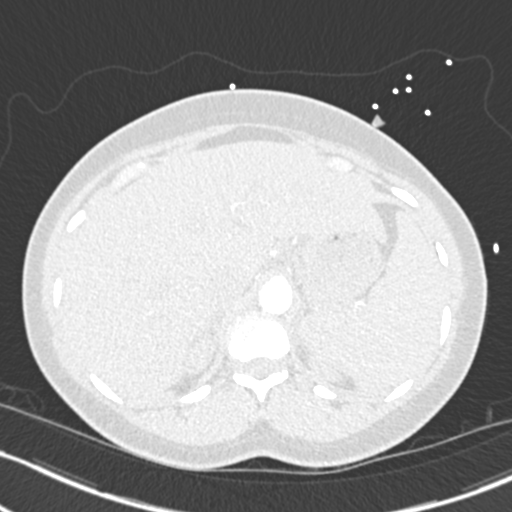
[im 34/261  soft-tissue]
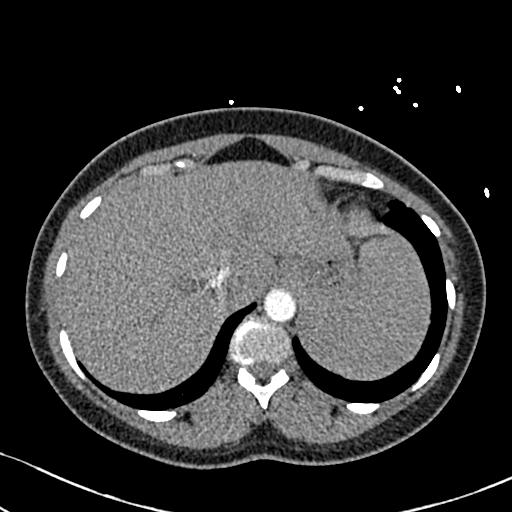
[im 46/261  lung]
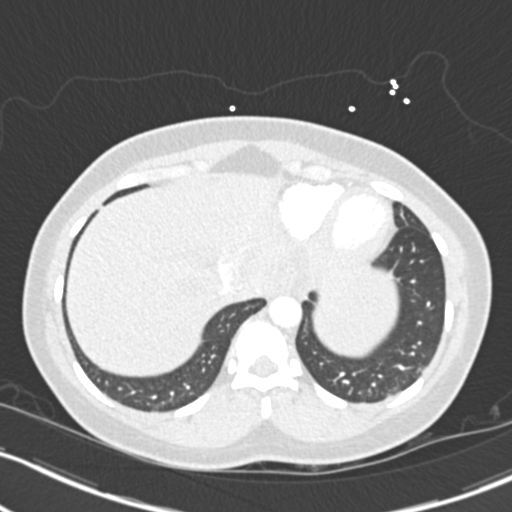
[im 57/261  soft-tissue]
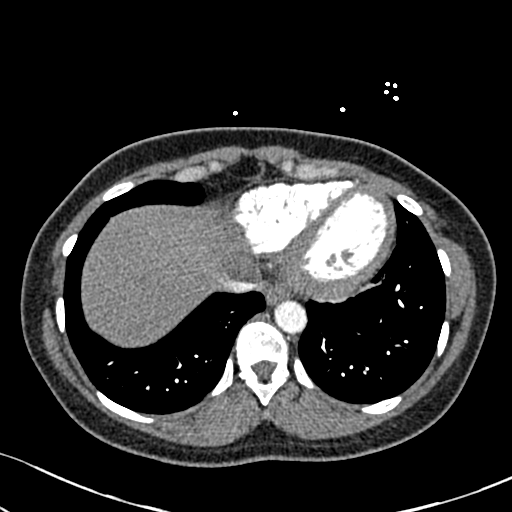
[im 80/261  lung]
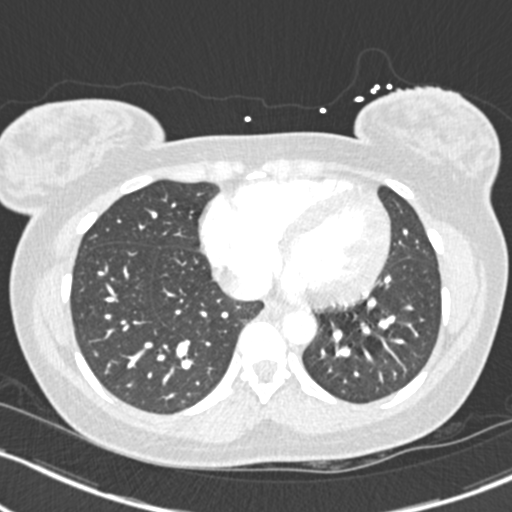
[im 91/261  soft-tissue]
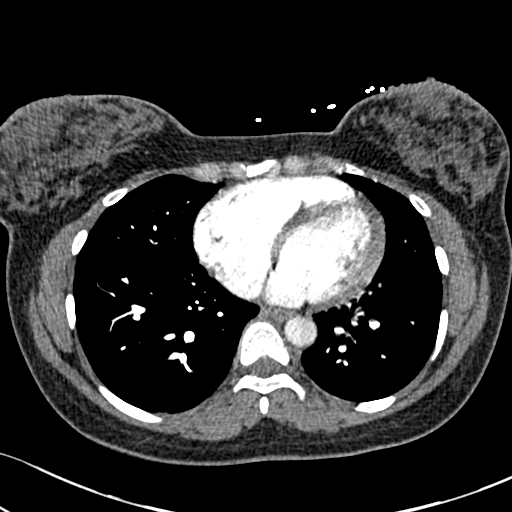
[im 102/261  lung]
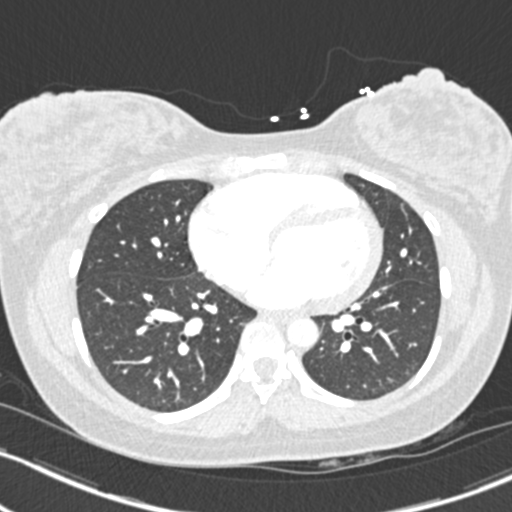
[im 125/261  soft-tissue]
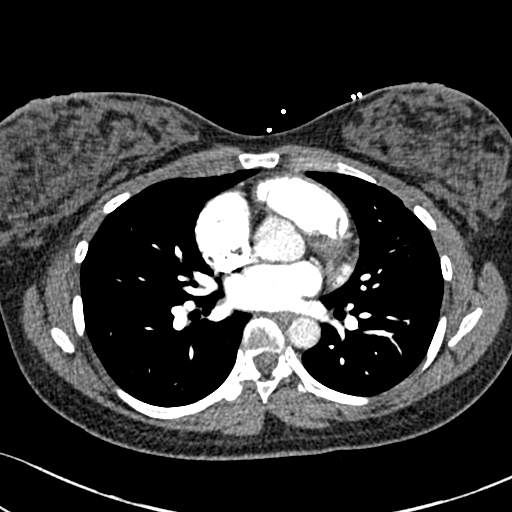
[im 136/261  lung]
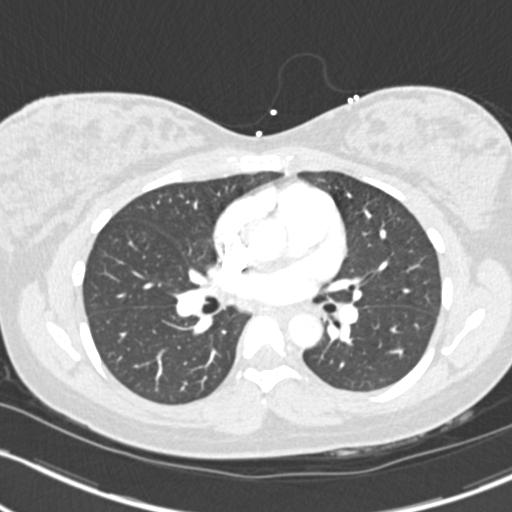
[im 159/261  soft-tissue]
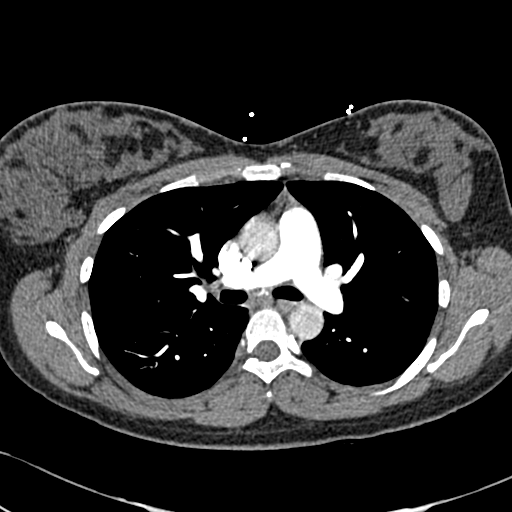
[im 170/261  lung]
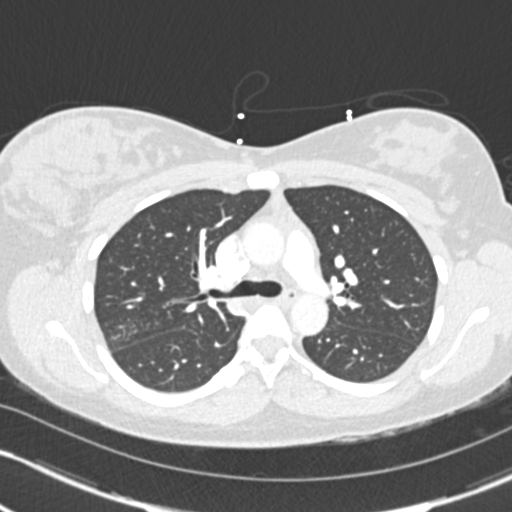
[im 181/261  soft-tissue]
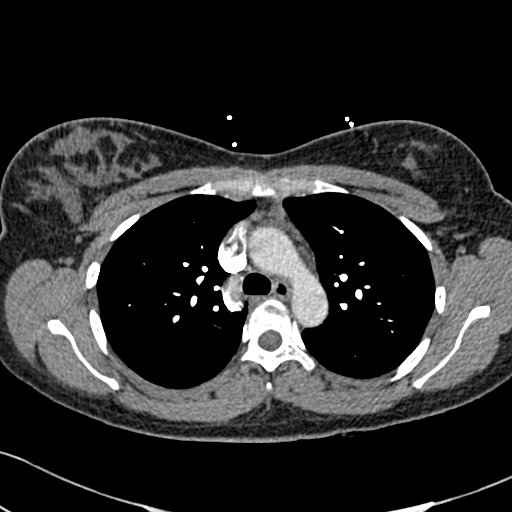
[im 204/261  lung]
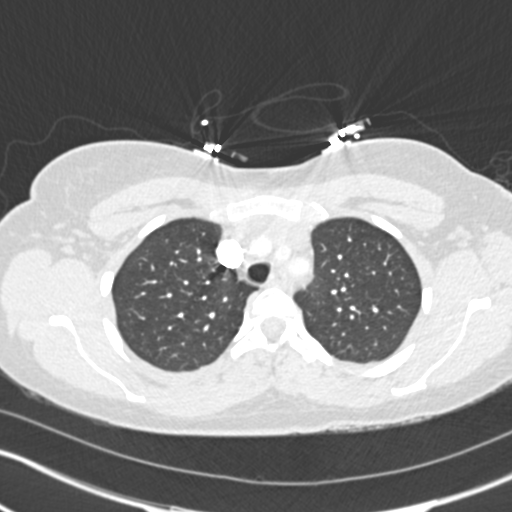
[im 215/261  soft-tissue]
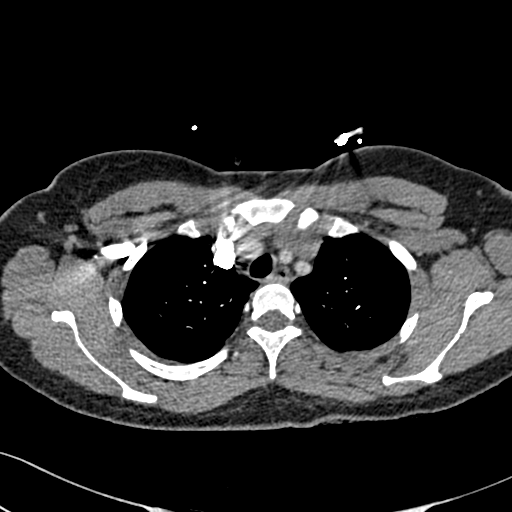
[im 227/261  lung]
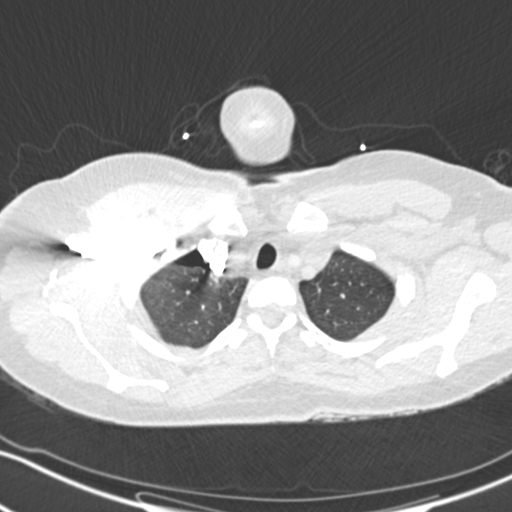
[im 249/261  soft-tissue]
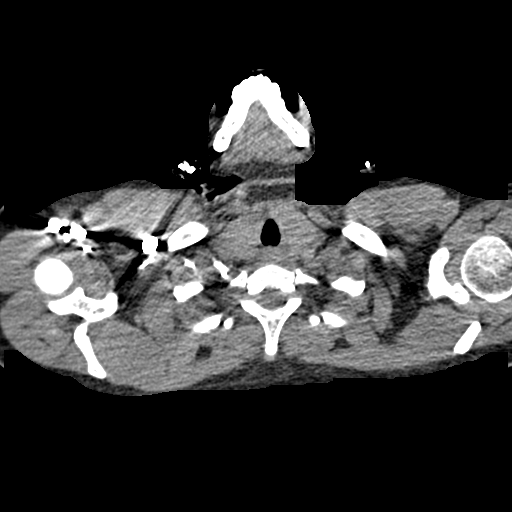

[Series 7: coronal mpr · coronal · 0.54mm/px · 3 of 111 slices shown]
[im 28/111  soft-tissue]
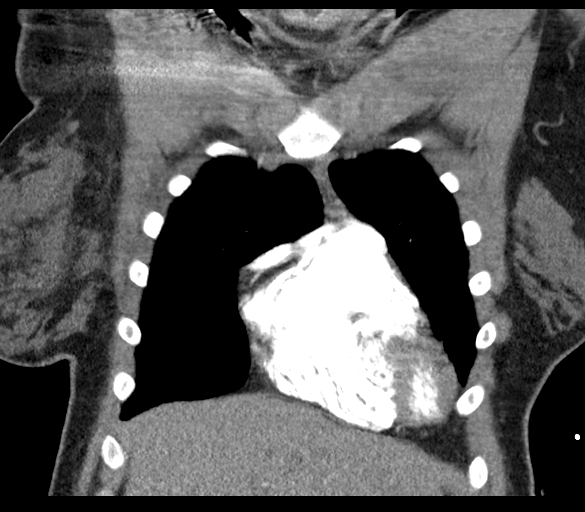
[im 56/111  soft-tissue]
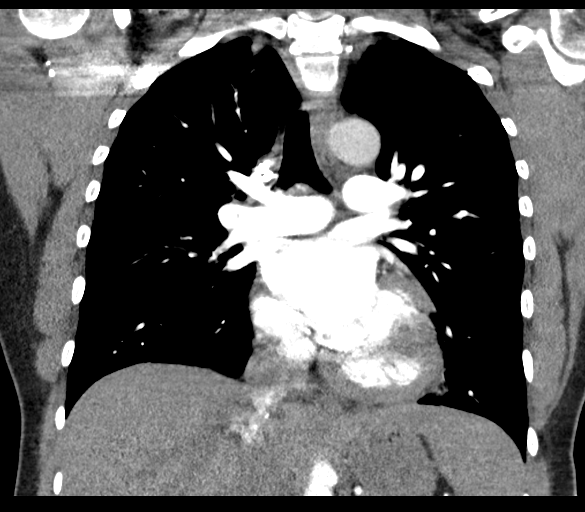
[im 83/111  soft-tissue]
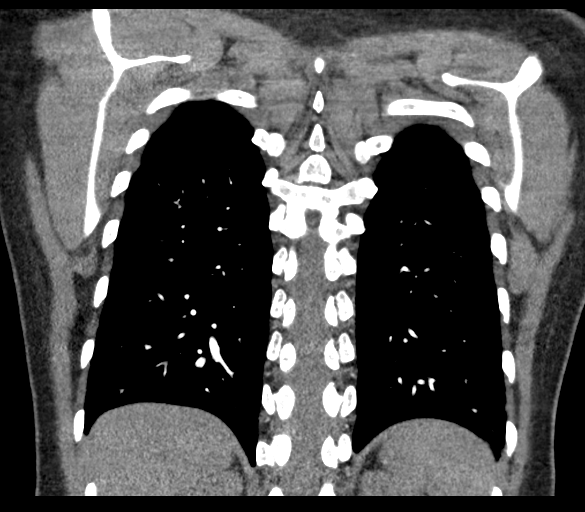

[19 of 46 positions shown; findings below may reference images not displayed]

FINDINGS: Cardiovascular: There are no filling defects within the pulmonary
arteries to suggest pulmonary embolus. Thoracic aorta is normal in
caliber. No evidence for dissection allowing for phase of contrast.

Mediastinum/Nodes: Soft tissue density anterior mediastinum likely
recurrent thymus. No adenopathy. The esophagus is decompressed. No
pericardial fluid.

Lungs/Pleura: Faint ground-glass and reticulonodular opacities in
the dependent right upper lobe. Minimal subsegmental atelectasis in
the lingula. Lungs are otherwise clear. No confluent airspace
disease. No pleural fluid. Trachea and mainstem bronchi are patent.

Upper Abdomen: No acute abnormality.

Musculoskeletal: There are no acute or suspicious osseous
abnormalities.

Review of the MIP images confirms the above findings.
IMPRESSION: 1. No pulmonary embolus.
2. Faint ground-glass and reticulonodular opacities in the right
upper lobe, likely infectious or inflammatory. Minimal aspiration
could have this appearance.

## 2017-10-10 NOTE — L&D Delivery Note (Signed)
Delivery Note   Hayley Kirk is a 32 y.o. [id number] at 6537w2d Estimated Date of Delivery: 09/15/18  PRE-OPERATIVE DIAGNOSIS:  1) 1737w2d pregnancy.   POST-OPERATIVE DIAGNOSIS:  1) 5937w2d pregnancy s/p Vaginal, Spontaneous   Delivery Type: Vaginal, Spontaneous    Delivery Anesthesia: Epidural   Labor Complications:   None    ESTIMATED BLOOD LOSS: 200 ml    FINDINGS:   1) female infant, Apgar scores of 8    at 1 minute and 9    at 5 minutes and a birthweight pending infant skin to skin.   2) Nuchal cord: No  SPECIMENS:   PLACENTA:   Appearance: Intact , 3 vessel cord   Removal: Spontaneous      Disposition:   held proprotocol   DISPOSITION:  Infant to left in stable condition in the delivery room, with L&D personnel and mother,  NARRATIVE SUMMARY: Labor course:  Ms. Hayley Kirk is a [id number] at 4737w2d who presented for labor management.  She progressed well in labor with pitocin.  She received the appropriate anesthesia and proceeded to complete dilation. She evidenced good maternal expulsive effort during the second stage. She went on to deliver a viable female infant "Hayley Kirk" . The placenta delivered without problems and was noted to be complete. A perineal and vaginal examination was performed. Episiotomy/Lacerations: None  The patient tolerated this well.  Doreene Burkennie Symphonie Schneiderman, CNM  09/10/2018 7:41 AM

## 2018-01-29 ENCOUNTER — Encounter (HOSPITAL_COMMUNITY): Payer: Self-pay | Admitting: Emergency Medicine

## 2018-01-29 ENCOUNTER — Emergency Department (HOSPITAL_COMMUNITY)
Admission: EM | Admit: 2018-01-29 | Discharge: 2018-01-29 | Disposition: A | Discharge status: Home / Self Care | Payer: Medicaid Other | Attending: Emergency Medicine | Admitting: Emergency Medicine

## 2018-01-29 ENCOUNTER — Other Ambulatory Visit: Payer: Self-pay

## 2018-01-29 DIAGNOSIS — R109 Unspecified abdominal pain: Secondary | ICD-10-CM | POA: Insufficient documentation

## 2018-01-29 DIAGNOSIS — R112 Nausea with vomiting, unspecified: Secondary | ICD-10-CM | POA: Diagnosis not present

## 2018-01-29 DIAGNOSIS — Z5321 Procedure and treatment not carried out due to patient leaving prior to being seen by health care provider: Secondary | ICD-10-CM | POA: Diagnosis not present

## 2018-01-29 LAB — URINALYSIS, ROUTINE W REFLEX MICROSCOPIC
BACTERIA UA: NONE SEEN
Bilirubin Urine: NEGATIVE
GLUCOSE, UA: NEGATIVE mg/dL
Hgb urine dipstick: NEGATIVE
KETONES UR: 5 mg/dL — AB
Nitrite: NEGATIVE
PROTEIN: NEGATIVE mg/dL
Specific Gravity, Urine: 1.03 (ref 1.005–1.030)
pH: 5 (ref 5.0–8.0)

## 2018-01-29 LAB — COMPREHENSIVE METABOLIC PANEL
ALK PHOS: 39 U/L (ref 38–126)
ALT: 10 U/L — ABNORMAL LOW (ref 14–54)
ANION GAP: 8 (ref 5–15)
AST: 15 U/L (ref 15–41)
Albumin: 3.8 g/dL (ref 3.5–5.0)
BILIRUBIN TOTAL: 0.9 mg/dL (ref 0.3–1.2)
BUN: 11 mg/dL (ref 6–20)
CHLORIDE: 105 mmol/L (ref 101–111)
CO2: 22 mmol/L (ref 22–32)
Calcium: 9 mg/dL (ref 8.9–10.3)
Creatinine, Ser: 0.74 mg/dL (ref 0.44–1.00)
GLUCOSE: 86 mg/dL (ref 65–99)
Potassium: 3.9 mmol/L (ref 3.5–5.1)
Sodium: 135 mmol/L (ref 135–145)
Total Protein: 6.8 g/dL (ref 6.5–8.1)

## 2018-01-29 LAB — CBC
HCT: 35.9 % — ABNORMAL LOW (ref 36.0–46.0)
HEMOGLOBIN: 12 g/dL (ref 12.0–15.0)
MCH: 32 pg (ref 26.0–34.0)
MCHC: 33.4 g/dL (ref 30.0–36.0)
MCV: 95.7 fL (ref 78.0–100.0)
Platelets: 212 10*3/uL (ref 150–400)
RBC: 3.75 MIL/uL — AB (ref 3.87–5.11)
RDW: 12.7 % (ref 11.5–15.5)
WBC: 5.6 10*3/uL (ref 4.0–10.5)

## 2018-01-29 LAB — I-STAT BETA HCG BLOOD, ED (MC, WL, AP ONLY)

## 2018-01-29 LAB — LIPASE, BLOOD: Lipase: 36 U/L (ref 11–51)

## 2018-01-29 NOTE — ED Notes (Signed)
Pt stated they are leaving 

## 2018-01-29 NOTE — ED Triage Notes (Signed)
Pt c/o abdominal pain with nausea/vomiting, denies diarrhea. No pain at this time.

## 2018-02-27 ENCOUNTER — Encounter: Payer: Self-pay | Admitting: Obstetrics and Gynecology

## 2018-02-27 ENCOUNTER — Ambulatory Visit: Payer: Medicaid Other

## 2018-02-27 ENCOUNTER — Ambulatory Visit (INDEPENDENT_AMBULATORY_CARE_PROVIDER_SITE_OTHER): Payer: Medicaid Other

## 2018-02-27 VITALS — BP 124/84 | HR 87 | Wt 141.6 lb

## 2018-02-27 DIAGNOSIS — N926 Irregular menstruation, unspecified: Secondary | ICD-10-CM | POA: Diagnosis not present

## 2018-02-27 NOTE — Progress Notes (Unsigned)
Hayley Kirk presents for NOB nurse interview visit. Pregnancy confirmation done @ [facility].  G- 7.  P 4-    . Pregnancy education material explained and given. __No_ cats in the home. NOB labs ordered. Marland Kitchen HIV labs and Drug screen were explained optional and she did not decline. Drug screen ordered/ PNV encouraged. Genetic screening options discussed. Genetic testing: [name].  Pt may discuss with provider. Pt. To follow up with provider in 1__ weeks for NOB physical.  All questions answered.

## 2018-02-27 NOTE — Patient Instructions (Signed)
First Trimester of Pregnancy The first trimester of pregnancy is from week 1 until the end of week 13 (months 1 through 3). During this time, your baby will begin to develop inside you. At 6-8 weeks, the eyes and face are formed, and the heartbeat can be seen on ultrasound. At the end of 12 weeks, all the baby's organs are formed. Prenatal care is all the medical care you receive before the birth of your baby. Make sure you get good prenatal care and follow all of your doctor's instructions. Follow these instructions at home: Medicines  Take over-the-counter and prescription medicines only as told by your doctor. Some medicines are safe and some medicines are not safe during pregnancy.  Take a prenatal vitamin that contains at least 600 micrograms (mcg) of folic acid.  If you have trouble pooping (constipation), take medicine that will make your stool soft (stool softener) if your doctor approves. Eating and drinking  Eat regular, healthy meals.  Your doctor will tell you the amount of weight gain that is right for you.  Avoid raw meat and uncooked cheese.  If you feel sick to your stomach (nauseous) or throw up (vomit): ? Eat 4 or 5 small meals a day instead of 3 large meals. ? Try eating a few soda crackers. ? Drink liquids between meals instead of during meals.  To prevent constipation: ? Eat foods that are high in fiber, like fresh fruits and vegetables, whole grains, and beans. ? Drink enough fluids to keep your pee (urine) clear or pale yellow. Activity  Exercise only as told by your doctor. Stop exercising if you have cramps or pain in your lower belly (abdomen) or low back.  Do not exercise if it is too hot, too humid, or if you are in a place of great height (high altitude).  Try to avoid standing for long periods of time. Move your legs often if you must stand in one place for a long time.  Avoid heavy lifting.  Wear low-heeled shoes. Sit and stand up straight.  You  can have sex unless your doctor tells you not to. Relieving pain and discomfort  Wear a good support bra if your breasts are sore.  Take warm water baths (sitz baths) to soothe pain or discomfort caused by hemorrhoids. Use hemorrhoid cream if your doctor says it is okay.  Rest with your legs raised if you have leg cramps or low back pain.  If you have puffy, bulging veins (varicose veins) in your legs: ? Wear support hose or compression stockings as told by your doctor. ? Raise (elevate) your feet for 15 minutes, 3-4 times a day. ? Limit salt in your food. Prenatal care  Schedule your prenatal visits by the twelfth week of pregnancy.  Write down your questions. Take them to your prenatal visits.  Keep all your prenatal visits as told by your doctor. This is important. Safety  Wear your seat belt at all times when driving.  Make a list of emergency phone numbers. The list should include numbers for family, friends, the hospital, and police and fire departments. General instructions  Ask your doctor for a referral to a local prenatal class. Begin classes no later than at the start of month 6 of your pregnancy.  Ask for help if you need counseling or if you need help with nutrition. Your doctor can give you advice or tell you where to go for help.  Do not use hot tubs, steam rooms, or   saunas.  Do not douche or use tampons or scented sanitary pads.  Do not cross your legs for long periods of time.  Avoid all herbs and alcohol. Avoid drugs that are not approved by your doctor.  Do not use any tobacco products, including cigarettes, chewing tobacco, and electronic cigarettes. If you need help quitting, ask your doctor. You may get counseling or other support to help you quit.  Avoid cat litter boxes and soil used by cats. These carry germs that can cause birth defects in the baby and can cause a loss of your baby (miscarriage) or stillbirth.  Visit your dentist. At home, brush  your teeth with a soft toothbrush. Be gentle when you floss. Contact a doctor if:  You are dizzy.  You have mild cramps or pressure in your lower belly.  You have a nagging pain in your belly area.  You continue to feel sick to your stomach, you throw up, or you have watery poop (diarrhea).  You have a bad smelling fluid coming from your vagina.  You have pain when you pee (urinate).  You have increased puffiness (swelling) in your face, hands, legs, or ankles. Get help right away if:  You have a fever.  You are leaking fluid from your vagina.  You have spotting or bleeding from your vagina.  You have very bad belly cramping or pain.  You gain or lose weight rapidly.  You throw up blood. It may look like coffee grounds.  You are around people who have German measles, fifth disease, or chickenpox.  You have a very bad headache.  You have shortness of breath.  You have any kind of trauma, such as from a fall or a car accident. Summary  The first trimester of pregnancy is from week 1 until the end of week 13 (months 1 through 3).  To take care of yourself and your unborn baby, you will need to eat healthy meals, take medicines only if your doctor tells you to do so, and do activities that are safe for you and your baby.  Keep all follow-up visits as told by your doctor. This is important as your doctor will have to ensure that your baby is healthy and growing well. This information is not intended to replace advice given to you by your health care provider. Make sure you discuss any questions you have with your health care provider. Document Released: 03/14/2008 Document Revised: 10/04/2016 Document Reviewed: 10/04/2016 Elsevier Interactive Patient Education  2017 [employer]  

## 2018-02-28 LAB — CBC WITH DIFFERENTIAL/PLATELET
BASOS ABS: 0 10*3/uL (ref 0.0–0.2)
BASOS: 0 %
EOS (ABSOLUTE): 0.4 10*3/uL (ref 0.0–0.4)
Eos: 7 %
Hematocrit: 36.3 % (ref 34.0–46.6)
Hemoglobin: 11.9 g/dL (ref 11.1–15.9)
IMMATURE GRANULOCYTES: 0 %
Immature Grans (Abs): 0 10*3/uL (ref 0.0–0.1)
LYMPHS: 26 %
Lymphocytes Absolute: 1.4 10*3/uL (ref 0.7–3.1)
MCH: 31.9 pg (ref 26.6–33.0)
MCHC: 32.8 g/dL (ref 31.5–35.7)
MCV: 97 fL (ref 79–97)
Monocytes Absolute: 0.4 10*3/uL (ref 0.1–0.9)
Monocytes: 7 %
NEUTROS PCT: 60 %
Neutrophils Absolute: 3.3 10*3/uL (ref 1.4–7.0)
PLATELETS: 239 10*3/uL (ref 150–450)
RBC: 3.73 x10E6/uL — AB (ref 3.77–5.28)
RDW: 13.1 % (ref 12.3–15.4)
WBC: 5.5 10*3/uL (ref 3.4–10.8)

## 2018-02-28 LAB — MONITOR DRUG PROFILE 14(MW)
Amphetamine Scrn, Ur: NEGATIVE ng/mL
BARBITURATE SCREEN URINE: NEGATIVE ng/mL
BENZODIAZEPINE SCREEN, URINE: NEGATIVE ng/mL
Buprenorphine, Urine: NEGATIVE ng/mL
CANNABINOIDS UR QL SCN: NEGATIVE ng/mL
Cocaine (Metab) Scrn, Ur: NEGATIVE ng/mL
Creatinine(Crt), U: 149.2 mg/dL (ref 20.0–300.0)
Fentanyl, Urine: NEGATIVE pg/mL
Meperidine Screen, Urine: NEGATIVE ng/mL
Methadone Screen, Urine: NEGATIVE ng/mL
OXYCODONE+OXYMORPHONE UR QL SCN: NEGATIVE ng/mL
Opiate Scrn, Ur: NEGATIVE ng/mL
Ph of Urine: 6.4 (ref 4.5–8.9)
Phencyclidine Qn, Ur: NEGATIVE ng/mL
Propoxyphene Scrn, Ur: NEGATIVE ng/mL
SPECIFIC GRAVITY: 1.025
Tramadol Screen, Urine: NEGATIVE ng/mL

## 2018-02-28 LAB — MICROSCOPIC EXAMINATION
Casts: NONE SEEN /LPF
Epithelial Cells (non renal): >10 /HPF — AB (ref 0–10)

## 2018-02-28 LAB — URINALYSIS, ROUTINE W REFLEX MICROSCOPIC
Bilirubin, UA: NEGATIVE
Glucose, UA: NEGATIVE
Ketones, UA: NEGATIVE
Nitrite, UA: NEGATIVE
Protein, UA: NEGATIVE
RBC, UA: NEGATIVE
Specific Gravity, UA: 1.028 (ref 1.005–1.030)
Urobilinogen, Ur: 0.2 mg/dL (ref 0.2–1.0)
pH, UA: 6 (ref 5.0–7.5)

## 2018-02-28 LAB — HIV ANTIBODY (ROUTINE TESTING W REFLEX): HIV Screen 4th Generation wRfx: NONREACTIVE

## 2018-02-28 LAB — RPR: RPR: NONREACTIVE

## 2018-02-28 LAB — VARICELLA ZOSTER ANTIBODY, IGG: Varicella zoster IgG: 1439 {index}

## 2018-02-28 LAB — "ABO AND RH ": Rh Factor: POSITIVE

## 2018-02-28 LAB — ANTIBODY SCREEN: Antibody Screen: NEGATIVE

## 2018-02-28 LAB — RUBELLA SCREEN: RUBELLA: 13.1 {index} (ref >0.99)

## 2018-02-28 LAB — HEPATITIS B SURFACE ANTIGEN: Hepatitis B Surface Ag: NEGATIVE

## 2018-03-01 LAB — URINE CULTURE: ORGANISM ID, BACTERIA: NO GROWTH

## 2018-03-01 LAB — GC/CHLAMYDIA PROBE AMP
Chlamydia trachomatis, NAA: NEGATIVE
Neisseria gonorrhoeae by PCR: NEGATIVE

## 2018-03-07 ENCOUNTER — Ambulatory Visit (INDEPENDENT_AMBULATORY_CARE_PROVIDER_SITE_OTHER): Payer: Medicaid Other | Admitting: Obstetrics and Gynecology

## 2018-03-07 ENCOUNTER — Encounter: Payer: Self-pay | Admitting: Obstetrics and Gynecology

## 2018-03-07 VITALS — BP 101/63 | HR 77 | Wt 140.4 lb

## 2018-03-07 DIAGNOSIS — Z3481 Encounter for supervision of other normal pregnancy, first trimester: Secondary | ICD-10-CM

## 2018-03-07 MED ORDER — DOCUSATE SODIUM 100 MG PO CAPS: 100.0000 mg | ORAL_CAPSULE | Freq: Two times a day (BID) | ORAL | 2 refills | Status: AC

## 2018-03-07 NOTE — Progress Notes (Signed)
Pt is here for an NOB PE.

## 2018-03-07 NOTE — Progress Notes (Signed)
NEW OB HISTORY AND PHYSICAL  SUBJECTIVE:       Hayley Kirk is a 32 y.o. [phone] female, Patient's last menstrual period was 12/09/2017., Estimated Date of Delivery: 09/15/18, [redacted]w[redacted]d, presents today for establishment of Prenatal Care. She has no unusual complaints and complains of constipation      Gynecologic History Patient's last menstrual period was 12/09/2017. Unknown Contraception: none Last Pap: 2017. Results were: normal  Obstetric History OB History  Gravida Para Term Preterm AB Living  SAB TAB Ectopic Multiple Live Births  1 1 0 0 4    # Outcome Date GA Lbr Len/2nd Weight Sex Delivery Anes PTL Lv  7 Current           6 Term 05/08/16 [redacted]w[redacted]d / 00:09 8 lb 8.2 oz (3.86 kg) F Vag-Spont EPI  LIV  5 TAB 02/2015 [redacted]w[redacted]d       FD  4 SAB 2014 [redacted]w[redacted]d       FD  3 Term 03/18/12 [redacted]w[redacted]d  6 lb (2.722 kg) M Vag-Spont   LIV  2 Preterm 06/09/09 [redacted]w[redacted]d  5 lb (2.268 kg) M Vag-Spont  Y LIV  1 Term 04/19/08 [redacted]w[redacted]d  7 lb (3.175 kg) F Vag-Spont  N LIV    Obstetric Comments  Pt told after miscarriage that she could not have any more children "punch a hole in cervix?"    Past Medical History:  Diagnosis Date  . Migraines    hx of migraines    Past Surgical History:  Procedure Laterality Date  . DILATION AND CURETTAGE OF UTERUS      Current Outpatient Medications on File Prior to Visit  Medication Sig Dispense Refill  . Prenatal Vit-Fe Fumarate-FA (PRENATAL MULTIVITAMIN) TABS tablet Take 1 tablet by mouth daily at 12 noon.     No current facility-administered medications on file prior to visit.     No Known Allergies  Social History   Socioeconomic History  . Marital status: Single    Spouse name: Not on file  . Number of children: Not on file  . Years of education: Not on file  . Highest education level: Not on file  Occupational History  . Occupation: Airline pilot    Comment: serves ice cream  Social Needs  . Financial resource strain: Not on file  . Food insecurity:     Worry: Not on file    Inability: Not on file  . Transportation needs:    Medical: Not on file    Non-medical: Not on file  Tobacco Use  . Smoking status: Never Smoker  . Smokeless tobacco: Never Used  Substance and Sexual Activity  . Alcohol use: No    Alcohol/week: 0.0 oz  . Drug use: No  . Sexual activity: Yes    Partners: Male  Lifestyle  . Physical activity:    Days per week: Not on file    Minutes per session: Not on file  . Stress: Not on file  Relationships  . Social connections:    Talks on phone: Not on file    Gets together: Not on file    Attends religious service: Not on file    Active member of club or organization: Not on file    Attends meetings of clubs or organizations: Not on file    Relationship status: Not on file  . Intimate partner violence:    Fear of current or ex partner: Not on file    Emotionally abused:  Not on file    Physically abused: Not on file    Forced sexual activity: Not on file  Other Topics Concern  . Not on file  Social History Narrative  . Not on file    Family History  Problem Relation Age of Onset  . Breast cancer Mother        stage 4  . Stomach cancer Mother   . Diabetes Mother   . Hyperlipidemia Mother   . Hypertension Mother   . Stroke Mother   . Heart failure Father   . Breast cancer Maternal Aunt        x2  . Stomach cancer Maternal Aunt        passed away 32yrs ago  . Migraines Sister     The following portions of the patient's history were reviewed and updated as appropriate: allergies, current medications, past OB history, past medical history, past surgical history, past family history, past social history, and problem list.    OBJECTIVE: Initial Physical Exam (New OB)  GENERAL APPEARANCE: alert, well appearing, in no apparent distress, oriented to person, place and time HEAD: normocephalic, atraumatic MOUTH: mucous membranes moist, pharynx normal without lesions and dental hygiene good THYROID: no  thyromegaly or masses present BREASTS: not examined LUNGS: clear to auscultation, no wheezes, rales or rhonchi, symmetric air entry HEART: regular rate and rhythm, no murmurs ABDOMEN: soft, nontender, nondistended, no abnormal masses, no epigastric pain, fundus not palpable and FHT present EXTREMITIES: no redness or tenderness in the calves or thighs SKIN: normal coloration and turgor, no rashes LYMPH NODES: no adenopathy palpable NEUROLOGIC: alert, oriented, normal speech, no focal findings or movement disorder noted  PELVIC EXAM not indicated  ASSESSMENT: Normal pregnancy  PLAN: Prenatal care Reviewed labs, still awaiting genetic screening results. See orders

## 2018-03-07 NOTE — Patient Instructions (Signed)
Second Trimester of Pregnancy The second trimester is from week 13 through week 28, month 4 through 6. This is often the time in pregnancy that you feel your best. Often times, morning sickness has lessened or quit. You may have more energy, and you may get hungry more often. Your unborn baby (fetus) is growing rapidly. At the end of the sixth month, he or she is about 9 inches long and weighs about 1 pounds. You will likely feel the baby move (quickening) between 18 and 20 weeks of pregnancy. Follow these instructions at home:  Avoid all smoking, herbs, and alcohol. Avoid drugs not approved by your doctor.  Do not use any tobacco products, including cigarettes, chewing tobacco, and electronic cigarettes. If you need help quitting, ask your doctor. You may get counseling or other support to help you quit.  Only take medicine as told by your doctor. Some medicines are safe and some are not during pregnancy.  Exercise only as told by your doctor. Stop exercising if you start having cramps.  Eat regular, healthy meals.  Wear a good support bra if your breasts are tender.  Do not use hot tubs, steam rooms, or saunas.  Wear your seat belt when driving.  Avoid raw meat, uncooked cheese, and liter boxes and soil used by cats.  Take your prenatal vitamins.  Take 1500-2000 milligrams of calcium daily starting at the 20th week of pregnancy until you deliver your baby.  Try taking medicine that helps you poop (stool softener) as needed, and if your doctor approves. Eat more fiber by eating fresh fruit, vegetables, and whole grains. Drink enough fluids to keep your pee (urine) clear or pale yellow.  Take warm water baths (sitz baths) to soothe pain or discomfort caused by hemorrhoids. Use hemorrhoid cream if your doctor approves.  If you have puffy, bulging veins (varicose veins), wear support hose. Raise (elevate) your feet for 15 minutes, 3-4 times a day. Limit salt in your diet.  Avoid heavy  lifting, wear low heals, and sit up straight.  Rest with your legs raised if you have leg cramps or low back pain.  Visit your dentist if you have not gone during your pregnancy. Use a soft toothbrush to brush your teeth. Be gentle when you floss.  You can have sex (intercourse) unless your doctor tells you not to.  Go to your doctor visits. Get help if:  You feel dizzy.  You have mild cramps or pressure in your lower belly (abdomen).  You have a nagging pain in your belly area.  You continue to feel sick to your stomach (nauseous), throw up (vomit), or have watery poop (diarrhea).  You have bad smelling fluid coming from your vagina.  You have pain with peeing (urination). Get help right away if:  You have a fever.  You are leaking fluid from your vagina.  You have spotting or bleeding from your vagina.  You have severe belly cramping or pain.  You lose or gain weight rapidly.  You have trouble catching your breath and have chest pain.  You notice sudden or extreme puffiness (swelling) of your face, hands, ankles, feet, or legs.  You have not felt the baby move in over an hour.  You have severe headaches that do not go away with medicine.  You have vision changes. This information is not intended to replace advice given to you by your health care provider. Make sure you discuss any questions you have with your health care   provider. Document Released: 12/21/2009 Document Revised: 03/03/2016 Document Reviewed: 11/27/2012 Elsevier Interactive Patient Education  2017 Elsevier Inc.  

## 2018-04-09 ENCOUNTER — Ambulatory Visit (INDEPENDENT_AMBULATORY_CARE_PROVIDER_SITE_OTHER): Payer: Medicaid Other | Admitting: Certified Nurse Midwife

## 2018-04-09 VITALS — BP 113/65 | HR 69 | Wt 145.7 lb

## 2018-04-09 DIAGNOSIS — Z8659 Personal history of other mental and behavioral disorders: Secondary | ICD-10-CM | POA: Insufficient documentation

## 2018-04-09 DIAGNOSIS — Z3482 Encounter for supervision of other normal pregnancy, second trimester: Secondary | ICD-10-CM | POA: Diagnosis not present

## 2018-04-09 LAB — POCT URINALYSIS DIPSTICK
BILIRUBIN UA: NEGATIVE
Blood, UA: NEGATIVE
Glucose, UA: NEGATIVE
KETONES UA: NEGATIVE
Leukocytes, UA: NEGATIVE
Nitrite, UA: NEGATIVE
Protein, UA: NEGATIVE
Spec Grav, UA: 1.01 (ref 1.010–1.025)
UROBILINOGEN UA: 0.2 U/dL
pH, UA: 7 (ref 5.0–8.0)

## 2018-04-09 NOTE — Patient Instructions (Signed)
Preventing Preterm Birth Preterm birth is when your baby is delivered between 72 weeks and 37 weeks of pregnancy. A full-term pregnancy lasts for at least 37 weeks. Preterm birth can be dangerous for your baby because the last few weeks of pregnancy are an important time for your baby's brain and lungs to grow. Many things can cause a baby to be born early. Sometimes the cause is not known. There are certain factors that make you more likely to experience preterm birth, such as:  Having a previous baby born preterm.  Being pregnant with twins or other multiples.  Having had fertility treatment.  Being overweight or underweight at the start of your pregnancy.  Having any of the following during pregnancy: ? An infection, including a urinary tract infection (UTI) or an STI (sexually transmitted infection). ? High blood pressure. ? Diabetes. ? Vaginal bleeding.  Being age 30 or older.  Being age 30 or younger.  Getting pregnant within 6 months of a previous pregnancy.  Suffering extreme stress or physical or emotional abuse during pregnancy.  Standing for long periods of time during pregnancy, such as working at a job that requires standing.  What are the risks? The most serious risk of preterm birth is that the baby may not survive. This is more likely to happen if a baby is born before 13 weeks. Other risks and complications of preterm birth may include your baby having:  Breathing problems.  Brain damage that affects movement and coordination (cerebral palsy).  Feeding difficulties.  Vision or hearing problems.  Infections or inflammation of the digestive tract (colitis).  Developmental delays.  Learning disabilities.  Higher risk for diabetes, heart disease, and high blood pressure later in life.  What can I do to lower my risk? Medical care  The most important thing you can do to lower your risk for preterm birth is to get routine medical care during pregnancy  (prenatal care). If you have a high risk of preterm birth, you may be referred to a health care provider who specializes in managing high-risk pregnancies (perinatologist). You may be given medicine to help prevent preterm birth. Lifestyle changes Certain lifestyle changes can also lower your risk of preterm birth:  Wait at least 6 months after a pregnancy to become pregnant again.  Try to plan pregnancy for when you are between 30 and 77 years old.  Get to a healthy weight before getting pregnant. If you are overweight, work with your health care provider to safely lose weight.  Do not use any products that contain nicotine or tobacco, such as cigarettes and e-cigarettes. If you need help quitting, ask your health care provider.  Do not drink alcohol.  Do not use drugs.  Where to find support: For more support, consider:  Talking with your health care provider.  Talking with a therapist or substance abuse counselor, if you need help quitting.  Working with a diet and nutrition specialist (dietitian) or a Physiological scientist to maintain a healthy weight.  Joining a support group.  Where to find more information: Learn more about preventing preterm birth from:  Centers for Disease Control and Prevention: VoipObserver.com.br  March of Dimes: marchofdimes.org/complications/premature-babies.aspx  American Pregnancy Association: americanpregnancy.org/labor-and-birth/premature-labor  Contact a health care provider if:  You have any of the following signs of preterm labor before 37 weeks: ? A change or increase in vaginal discharge. ? Fluid leaking from your vagina. ? Pressure or cramps in your lower abdomen. ? A backache that does not  go away or gets worse. ? Regular tightening (contractions) in your lower abdomen. Summary  Preterm birth means having your baby during weeks 31-37 of pregnancy.  Preterm birth may put your baby at risk  for physical and mental problems.  Getting good prenatal care can help prevent preterm birth.  You can lower your risk of preterm birth by making certain lifestyle changes, such as not smoking and not using alcohol. This information is not intended to replace advice given to you by your health care provider. Make sure you discuss any questions you have with your health care provider. Document Released: 11/10/2015 Document Revised: 06/04/2016 Document Reviewed: 06/04/2016 Elsevier Interactive Patient Education  2018 [employer]. Hydroxyprogesterone solution for injection What is this medicine? HYDROXYPROGESTERONE (hye drox ee proe JES ter one) is a female hormone. This medicine is used in women who are pregnant and who have delivered a baby too early (preterm) in the past. It helps lower the risk of having a preterm baby again. This medicine may be used for other purposes; ask your health care provider or pharmacist if you have questions. COMMON BRAND NAME(S): Makena What should I tell my health care provider before I take this medicine? They need to know if you have any of these conditions: -blood clotting disorders -breast, cervical, uterine, or vaginal cancer -depression -diabetes or prediabetes -heart disease -high blood pressure -kidney disease -liver disease -lung or breathing disease, like asthma -migraine headaches -seizures -vaginal bleeding -an unusual or allergic reaction to hydroxyprogesterone, other hormones, medicines, foods, dyes, castor oil, benzyl alcohol, or other preservatives -breast-feeding How should I use this medicine? This medicine is for injection into a muscle. It is given by a health care professional in a hospital or clinic setting. You are likely to get an injection once a week to prevent preterm delivery. Talk to your pediatrician regarding the use of this medicine in children. Special care may be needed. Overdosage: If you think you have taken too much  of this medicine contact a poison control center or emergency room at once. NOTE: This medicine is only for you. Do not share this medicine with others. What if I miss a dose? It is important not to miss your dose. Call your doctor or health care professional if you are unable to keep an appointment. What may interact with this medicine? -acetaminophen -bupropion -clozapine -efavirenz -halothane -methadone -nicotine -theophylline, aminophylline -tizanidine This list may not describe all possible interactions. Give your health care provider a list of all the medicines, herbs, non-prescription drugs, or dietary supplements you use. Also tell them if you smoke, drink alcohol, or use illegal drugs. Some items may interact with your medicine. What should I watch for while using this medicine? Your condition will be monitored carefully while you are receiving this medicine. What side effects may I notice from receiving this medicine? Side effects that you should report to your doctor or health care professional as soon as possible: -allergic reactions like skin rash, itching or hives, swelling of the face, lips, or tongue -breathing problems -breast tissue changes or discharge -changes in vision -confusion, trouble speaking or understanding -depressed mood -increased hunger or thirst -increased urination -pain, redness, or irritation at site where injected -pain, swelling, warmth in the leg -shortness of breath, chest pain, swelling in a leg -sudden numbness or weakness of the face, arm or leg -sudden severe headaches -trouble walking, dizziness, loss of balance or coordination -unusually weak or tired -vaginal bleeding -yellowing of the eyes or skin  Side effects that usually do not require medical attention (report to your doctor or health care professional if they continue or are bothersome): -changes in emotions or moods -diarrhea -fluid retention and swelling -nausea This list  may not describe all possible side effects. Call your doctor for medical advice about side effects. You may report side effects to FDA at 1-800-FDA-1088. Where should I keep my medicine? This drug is given in a hospital or clinic and will not be stored at home. NOTE: This sheet is a summary. It may not cover all possible information. If you have questions about this medicine, talk to your doctor, pharmacist, or health care provider.  2018 Elsevier/Gold Standard (2009-11-17 11:17:12)

## 2018-04-09 NOTE — Progress Notes (Addendum)
ROB-Doing well, reports round ligament. Discussed home treatment measures including use of abdominal support. Belly band distributed from office, form completed and faxed. Education regarding use of 17P in pregnancy, patient will consider. Reviewed red flag symptoms and when to call. RTC x 3 weeks for anatomy scan and ROB or sooner if needed. Meeting with Jola BabinskiMarilyn, Pregnancy Case Manager after today's appointment. Endorses history of anxiety and depression, will reassess next visit.

## 2018-04-09 NOTE — Progress Notes (Signed)
ROB-PT stated that having contractions off and on. No other complaints.

## 2018-04-16 ENCOUNTER — Other Ambulatory Visit: Payer: Self-pay | Admitting: Certified Nurse Midwife

## 2018-04-16 DIAGNOSIS — Z3689 Encounter for other specified antenatal screening: Secondary | ICD-10-CM

## 2018-04-30 ENCOUNTER — Ambulatory Visit (INDEPENDENT_AMBULATORY_CARE_PROVIDER_SITE_OTHER): Payer: Medicaid Other

## 2018-04-30 ENCOUNTER — Ambulatory Visit (INDEPENDENT_AMBULATORY_CARE_PROVIDER_SITE_OTHER): Payer: Medicaid Other | Admitting: Certified Nurse Midwife

## 2018-04-30 ENCOUNTER — Encounter: Payer: Self-pay | Admitting: Certified Nurse Midwife

## 2018-04-30 VITALS — BP 112/65 | HR 87 | Wt 148.8 lb

## 2018-04-30 DIAGNOSIS — Z3492 Encounter for supervision of normal pregnancy, unspecified, second trimester: Secondary | ICD-10-CM | POA: Diagnosis not present

## 2018-04-30 DIAGNOSIS — Z363 Encounter for antenatal screening for malformations: Secondary | ICD-10-CM | POA: Diagnosis not present

## 2018-04-30 DIAGNOSIS — Z3689 Encounter for other specified antenatal screening: Secondary | ICD-10-CM

## 2018-04-30 LAB — POCT URINALYSIS DIPSTICK
BILIRUBIN UA: NEGATIVE
Glucose, UA: NEGATIVE
KETONES UA: NEGATIVE
Leukocytes, UA: NEGATIVE
Nitrite, UA: NEGATIVE
PH UA: 7.5 (ref 5.0–8.0)
Protein, UA: NEGATIVE
RBC UA: NEGATIVE
SPEC GRAV UA: 1.01 (ref 1.010–1.025)
UROBILINOGEN UA: 0.2 U/dL

## 2018-04-30 NOTE — Progress Notes (Signed)
ROB- anatomy scan done today, pt is doing well 

## 2018-04-30 NOTE — Patient Instructions (Signed)
Back Pain in Pregnancy Back pain during pregnancy is common. Back pain may be caused by several factors that are related to changes during your pregnancy. Follow these instructions at home: Managing pain, stiffness, and swelling  If directed, apply ice for sudden (acute) back pain. ? Put ice in a plastic bag. ? Place a towel between your skin and the bag. ? Leave the ice on for 20 minutes, 2-3 times per day.  If directed, apply heat to the affected area before you exercise: ? Place a towel between your skin and the heat pack or heating pad. ? Leave the heat on for 20-30 minutes. ? Remove the heat if your skin turns bright red. This is especially important if you are unable to feel pain, heat, or cold. You may have a greater risk of getting burned. Activity  Exercise as told by your health care provider. Exercising is the best way to prevent or manage back pain.  Listen to your body when lifting. If lifting hurts, ask for help or bend your knees. This uses your leg muscles instead of your back muscles.  Squat down when picking up something from the floor. Do not bend over.  Only use bed rest as told by your health care provider. Bed rest should only be used for the most severe episodes of back pain. Standing, Sitting, and Lying Down  Do not stand in one place for long periods of time.  Use good posture when sitting. Make sure your head rests over your shoulders and is not hanging forward. Use a pillow on your lower back if necessary.  Try sleeping on your side, preferably the left side, with a pillow or two between your legs. If you are sore after a night's rest, your bed may be too soft. A firm mattress may provide more support for your back during pregnancy. General instructions  Do not wear high heels.  Eat a healthy diet. Try to gain weight within your health care provider's recommendations.  Use a maternity girdle, elastic sling, or back brace as told by your health care  provider.  Take over-the-counter and prescription medicines only as told by your health care provider.  Keep all follow-up visits as told by your health care provider. This is important. This includes any visits with any specialists, such as a physical therapist. Contact a health care provider if:  Your back pain interferes with your daily activities.  You have increasing pain in other parts of your body. Get help right away if:  You develop numbness, tingling, weakness, or problems with the use of your arms or legs.  You develop severe back pain that is not controlled with medicine.  You have a sudden change in bowel or bladder control.  You develop shortness of breath, dizziness, or you faint.  You develop nausea, vomiting, or sweating.  You have back pain that is a rhythmic, cramping pain similar to labor pains. Labor pain is usually 1-2 minutes apart, lasts for about 1 minute, and involves a bearing down feeling or pressure in your pelvis.  You have back pain and your water breaks or you have vaginal bleeding.  You have back pain or numbness that travels down your leg.  Your back pain developed after you fell.  You develop pain on one side of your back.  You see blood in your urine.  You develop skin blisters in the area of your back pain. This information is not intended to replace advice given to you   by your health care provider. Make sure you discuss any questions you have with your health care provider. Document Released: 01/04/2006 Document Revised: 03/03/2016 Document Reviewed: 06/10/2015 Elsevier Interactive Patient Education  2018 Elsevier Inc. Round Ligament Pain The round ligament is a cord of muscle and tissue that helps to support the uterus. It can become a source of pain during pregnancy if it becomes stretched or twisted as the baby grows. The pain usually begins in the second trimester of pregnancy, and it can come and go until the baby is delivered. It is  not a serious problem, and it does not cause harm to the baby. Round ligament pain is usually a short, sharp, and pinching pain, but it can also be a dull, lingering, and aching pain. The pain is felt in the lower side of the abdomen or in the groin. It usually starts deep in the groin and moves up to the outside of the hip area. Pain can occur with:  A sudden change in position.  Rolling over in bed.  Coughing or sneezing.  Physical activity.  Follow these instructions at home: Watch your condition for any changes. Take these steps to help with your pain:  When the pain starts, relax. Then try: ? Sitting down. ? Flexing your knees up to your abdomen. ? Lying on your side with one pillow under your abdomen and another pillow between your legs. ? Sitting in a warm bath for 15-20 minutes or until the pain goes away.  Take over-the-counter and prescription medicines only as told by your health care provider.  Move slowly when you sit and stand.  Avoid long walks if they cause pain.  Stop or lessen your physical activities if they cause pain.  Contact a health care provider if:  Your pain does not go away with treatment.  You feel pain in your back that you did not have before.  Your medicine is not helping. Get help right away if:  You develop a fever or chills.  You develop uterine contractions.  You develop vaginal bleeding.  You develop nausea or vomiting.  You develop diarrhea.  You have pain when you urinate. This information is not intended to replace advice given to you by your health care provider. Make sure you discuss any questions you have with your health care provider. Document Released: 07/05/2008 Document Revised: 03/03/2016 Document Reviewed: 12/03/2014 Elsevier Interactive Patient Education  2018 Elsevier Inc.  

## 2018-04-30 NOTE — Progress Notes (Signed)
ROB-Doing well. Anatomy scan today complete and wnl; see findings below. Female-Hayley Kirk. Anticipatory guidance regarding course of prenatal care. Reviewed red flag symptoms and when to call. RTC x 4 weeks for ROB or sooner if needed. Meeting with Laurence AlyMarilyn, OB Pregnancy Case Manager, after today's appointment.     [facility]  PHQ-9 Total Score  5      ULTRASOUND REPORT  Location: [facility] Date of Service:  04/30/2018  Indications: Anatomy Findings:  Mason JimSingleton intrauterine pregnancy is visualized with FHR at 127 BPM. Biometrics give an (U/S) Gestational age of 30 2/7 weeks and an (U/S) EDD of 09/15/18; this correlates with the clinically established EDD of 09/15/18.  Fetal presentation is vertex.  EFW: 350 grams (0lb 12oz). Placenta: Anterior and grade 1. AFI: WNL subjectively.  Anatomic survey is complete and appears WNL; Gender - Female.   Right Ovary measures 1.7 x 1.7 x 1.3 cm. It is normal in appearance. Left Ovary measures 2.7 x 2.0 x 1.4 cm. It is normal appearance. There is no obvious evidence of a corpus luteal cyst. Survey of the adnexa demonstrates no adnexal masses. There is no free peritoneal fluid in the cul de sac.  Impression: 1. 20 2/7 week Viable Singleton Intrauterine pregnancy by U/S. 2. (U/S) EDD is consistent with Clinically established (LMP) EDD of 09/15/18. 3. Normal Anatomy Scan  Recommendations: 1.Clinical correlation with the patient's History and Physical Exam.

## 2018-05-03 ENCOUNTER — Telehealth: Payer: Self-pay | Admitting: Certified Nurse Midwife

## 2018-05-03 NOTE — Telephone Encounter (Signed)
Patient just called and asked if it is okay to take Hair Infinity - vitamin supplement w/ biotin to take with a prenatal vitamin daily?  Please advise, thanks.

## 2018-05-07 ENCOUNTER — Telehealth: Payer: Self-pay

## 2018-05-07 NOTE — Telephone Encounter (Signed)
Please contact patient. Unable to find FDA information regarding the use of horsetail, pea protein, and MSM in pregnancy. Would advise against the use of Hair Infinity at this time. Thanks, JML

## 2018-05-07 NOTE — Telephone Encounter (Signed)
Spoke with pt- relayed MLs message. No FDA info on ingredients of Hair Infinity could be found. Advised not to use product at this time. Pt was in agreement.

## 2018-05-18 ENCOUNTER — Telehealth: Payer: Self-pay | Admitting: Certified Nurse Midwife

## 2018-05-18 NOTE — Telephone Encounter (Signed)
The patient called and stated that she needs a nurse to write her a not to cancel her gym membership.The gym will not cancel the agreement without a not. Please advise.

## 2018-05-21 ENCOUNTER — Encounter: Payer: Self-pay | Admitting: *Deleted

## 2018-05-21 NOTE — Telephone Encounter (Signed)
Note was done and pt was notified she will pick up 05/21/18

## 2018-05-28 ENCOUNTER — Encounter: Payer: Medicaid Other | Admitting: Certified Nurse Midwife

## 2018-06-04 ENCOUNTER — Encounter: Payer: Medicaid Other | Admitting: Certified Nurse Midwife

## 2018-06-04 ENCOUNTER — Telehealth: Payer: Self-pay

## 2018-06-04 NOTE — Telephone Encounter (Signed)
Called pt to see why she missed her appointment today with [facility]- states her car is in the shop. Rescheduled appointment for 06/08/18 130 with [facility].

## 2018-06-08 ENCOUNTER — Encounter: Payer: Self-pay | Admitting: Certified Nurse Midwife

## 2018-06-08 ENCOUNTER — Ambulatory Visit (INDEPENDENT_AMBULATORY_CARE_PROVIDER_SITE_OTHER): Payer: Medicaid Other | Admitting: Certified Nurse Midwife

## 2018-06-08 VITALS — BP 101/66 | HR 73 | Wt 156.0 lb

## 2018-06-08 DIAGNOSIS — Z3482 Encounter for supervision of other normal pregnancy, second trimester: Secondary | ICD-10-CM

## 2018-06-08 LAB — POCT URINALYSIS DIPSTICK OB
Bilirubin, UA: NEGATIVE
Blood, UA: NEGATIVE
Glucose, UA: NEGATIVE
Leukocytes, UA: NEGATIVE
Nitrite, UA: NEGATIVE
POC,PROTEIN,UA: NEGATIVE
Spec Grav, UA: 1.025 (ref 1.010–1.025)
Urobilinogen, UA: 0.2 E.U./dL
pH, UA: 5 (ref 5.0–8.0)

## 2018-06-08 NOTE — Progress Notes (Signed)
ROB doing well. Discussed glucose testing in 2 wks. Feels good movement. Denies any concerns . ROB 2 wks   Doreene BurkeAnnie Trisa Cranor.CNM

## 2018-06-08 NOTE — Patient Instructions (Signed)
Glucose Tolerance Test During Pregnancy The glucose tolerance test (GTT) is a blood test used to determine if you have developed a type of diabetes during pregnancy (gestational diabetes). This is when your body does not properly process sugar (glucose) in the food you eat, resulting in high blood glucose levels. Typically, a GTT is done after you have had a 1-hour glucose test with results that indicate you possibly have gestational diabetes. It may also be done if:  You have a history of giving birth to very large babies or have experienced repeated fetal loss (stillbirth).  You have signs and symptoms of diabetes, such as: ? Changes in your vision. ? Tingling or numbness in your hands or feet. ? Changes in hunger, thirst, and urination not otherwise explained by your pregnancy.  The GTT lasts about 3 hours. You will be given a sugar-water solution to drink at the beginning of the test. You will have blood drawn before you drink the solution and then again 1, 2, and 3 hours after you drink it. You will not be allowed to eat or drink anything else during the test. You must remain at the testing location to make sure that your blood is drawn on time. You should also avoid exercising during the test, because exercise can alter test results. How do I prepare for this test? Eat normally for 3 days prior to the GTT test, including having plenty of carbohydrate-rich foods. Do not eat or drink anything except water during the final 12 hours before the test. In addition, your health care provider may ask you to stop taking certain medicines before the test. What do the results mean? It is your responsibility to obtain your test results. Ask the lab or department performing the test when and how you will get your results. Contact your health care provider to discuss any questions you have about your results. Range of Normal Values Ranges for normal values may vary among different labs and hospitals. You  should always check with your health care provider after having lab work or other tests done to discuss whether your values are considered within normal limits. Normal levels of blood glucose are as follows:  Fasting: less than 105 mg/dL.  1 hour after drinking the solution: less than 190 mg/dL.  2 hours after drinking the solution: less than 165 mg/dL.  3 hours after drinking the solution: less than 145 mg/dL.  Some substances can interfere with GTT results. These may include:  Blood pressure and heart failure medicines, including beta blockers, furosemide, and thiazides.  Anti-inflammatory medicines, including aspirin.  Nicotine.  Some psychiatric medicines.  Meaning of Results Outside Normal Value Ranges GTT test results that are above normal values may indicate a number of health problems, such as:  Gestational diabetes.  Acute stress response.  Cushing syndrome.  Tumors such as pheochromocytoma or glucagonoma.  Long-term kidney problems.  Pancreatitis.  Hyperthyroidism.  Current infection.  Discuss your test results with your health care provider. He or she will use the results to make a diagnosis and determine a treatment plan that is right for you. This information is not intended to replace advice given to you by your health care provider. Make sure you discuss any questions you have with your health care provider. Document Released: 03/27/2012 Document Revised: 03/03/2016 Document Reviewed: 01/31/2014 Elsevier Interactive Patient Education  2018 Elsevier Inc. Round Ligament Pain The round ligament is a cord of muscle and tissue that helps to support the uterus. It can   become a source of pain during pregnancy if it becomes stretched or twisted as the baby grows. The pain usually begins in the second trimester of pregnancy, and it can come and go until the baby is delivered. It is not a serious problem, and it does not cause harm to the baby. Round ligament pain  is usually a short, sharp, and pinching pain, but it can also be a dull, lingering, and aching pain. The pain is felt in the lower side of the abdomen or in the groin. It usually starts deep in the groin and moves up to the outside of the hip area. Pain can occur with:  A sudden change in position.  Rolling over in bed.  Coughing or sneezing.  Physical activity.  Follow these instructions at home: Watch your condition for any changes. Take these steps to help with your pain:  When the pain starts, relax. Then try: ? Sitting down. ? Flexing your knees up to your abdomen. ? Lying on your side with one pillow under your abdomen and another pillow between your legs. ? Sitting in a warm bath for 15-20 minutes or until the pain goes away.  Take over-the-counter and prescription medicines only as told by your health care provider.  Move slowly when you sit and stand.  Avoid long walks if they cause pain.  Stop or lessen your physical activities if they cause pain.  Contact a health care provider if:  Your pain does not go away with treatment.  You feel pain in your back that you did not have before.  Your medicine is not helping. Get help right away if:  You develop a fever or chills.  You develop uterine contractions.  You develop vaginal bleeding.  You develop nausea or vomiting.  You develop diarrhea.  You have pain when you urinate. This information is not intended to replace advice given to you by your health care provider. Make sure you discuss any questions you have with your health care provider. Document Released: 07/05/2008 Document Revised: 03/03/2016 Document Reviewed: 12/03/2014 Elsevier Interactive Patient Education  2018 Elsevier Inc.  

## 2018-06-18 ENCOUNTER — Encounter: Payer: Medicaid Other | Admitting: Certified Nurse Midwife

## 2018-06-18 ENCOUNTER — Other Ambulatory Visit: Payer: Medicaid Other

## 2018-06-26 ENCOUNTER — Emergency Department
Admission: EM | Admit: 2018-06-26 | Discharge: 2018-06-26 | Disposition: A | Discharge status: Home / Self Care | Payer: Medicaid Other | Attending: Emergency Medicine | Admitting: Emergency Medicine

## 2018-06-26 ENCOUNTER — Encounter: Payer: Self-pay | Admitting: Emergency Medicine

## 2018-06-26 ENCOUNTER — Other Ambulatory Visit: Payer: Self-pay

## 2018-06-26 DIAGNOSIS — R05 Cough: Secondary | ICD-10-CM | POA: Diagnosis present

## 2018-06-26 DIAGNOSIS — J40 Bronchitis, not specified as acute or chronic: Secondary | ICD-10-CM

## 2018-06-26 DIAGNOSIS — O99519 Diseases of the respiratory system complicating pregnancy, unspecified trimester: Secondary | ICD-10-CM | POA: Insufficient documentation

## 2018-06-26 DIAGNOSIS — Z3A28 28 weeks gestation of pregnancy: Secondary | ICD-10-CM | POA: Insufficient documentation

## 2018-06-26 DIAGNOSIS — Z79899 Other long term (current) drug therapy: Secondary | ICD-10-CM | POA: Diagnosis not present

## 2018-06-26 MED ORDER — ACETAMINOPHEN 325 MG PO TABS
650.0000 mg | ORAL_TABLET | Freq: Once | ORAL | Status: AC
  Administered 2018-06-26: 650 mg via ORAL
  Filled 2018-06-26: qty 2

## 2018-06-26 MED ORDER — ONDANSETRON 8 MG PO TBDP
8.0000 mg | ORAL_TABLET | Freq: Once | ORAL | Status: AC
  Administered 2018-06-26: 8 mg via ORAL
  Filled 2018-06-26: qty 1

## 2018-06-26 MED ORDER — AZITHROMYCIN 250 MG PO TABS: ORAL_TABLET | ORAL | 0 refills | Status: DC

## 2018-06-26 NOTE — ED Notes (Signed)
Pt stated that her symptoms started this morning. Back ache, feet swollen, lower abdomen hurts, HA, chills, sore throat, and coughing. Pt says she also feels very weak, no Nausea and vomiting. Family at bedside.

## 2018-06-26 NOTE — ED Provider Notes (Signed)
[facility] Emergency Department Provider Note ____________________________________________  Time seen: 2053  I have reviewed the triage vital signs and the nursing notes.  HISTORY  Chief Complaint  Cough and Nasal Congestion  HPI Hayley Kirk is a 32 y.o. female who presents to the ED for evaluation of a 1 day complaint of fevers, body aches, malaise, and cough.  Patient is at 5328 weeks gestation with a single pregnancy, and reports that she and her 4166-month-old daughter has been experiencing upper respiratory infections.  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  Reports productive cough as well as congestion.  She denies any abdominal pain, wheezing, chest pain, or pelvic discomfort related to the pregnancy.   Past Medical History:  Diagnosis Date  . Migraines    hx of migraines    Patient Active Problem List   Diagnosis Date Noted  . History of depression 04/09/2018  . History of anxiety 04/09/2018  . Pleuritic chest pain 05/11/2016  . UTI (lower urinary tract infection) 05/11/2016  . Family history of breast cancer in mother 10/29/2015  . History of preterm delivery, currently pregnant 09/21/2015    Past Surgical History:  Procedure Laterality Date  . DILATION AND CURETTAGE OF UTERUS      Prior to Admission medications   Medication Sig Start Date End Date Taking? Authorizing Provider  azithromycin (ZITHROMAX Z-PAK) 250 MG tablet Take 2 tablets (500 mg) on  Day 1,  followed by 1 tablet (250 mg) once daily on Days 2 through 5. 06/26/18   Jannis Atkins, Charlesetta IvoryJenise V Bacon, PA-C  docusate sodium (COLACE) 100 MG capsule Take 1 capsule (100 mg total) by mouth 2 (two) times daily. 03/07/18 03/07/19  Purcell NailsShambley, Melody N, CNM  Prenatal Vit-Fe Fumarate-FA (PRENATAL MULTIVITAMIN) TABS tablet Take 1 tablet by mouth daily at 12 noon.    [provider]    Allergies Patient has no  known allergies.  Family History  Problem Relation Age of Onset  . Breast cancer Mother        stage 4  . Stomach cancer Mother   . Diabetes Mother   . Hyperlipidemia Mother   . Hypertension Mother   . Stroke Mother   . Heart failure Father   . Breast cancer Maternal Aunt        x2  . Stomach cancer Maternal Aunt        passed away 4081yrs ago  . Migraines Sister     Social History Social History   Tobacco Use  . Smoking status: Never Smoker  . Smokeless tobacco: Never Used  Substance Use Topics  . Alcohol use: No    Alcohol/week: 0.0 standard drinks  . Drug use: No    Review of Systems  Constitutional: Positive for fever. Eyes: Negative for visual changes. ENT: Negative for sore throat. Cardiovascular: Negative for chest pain. Respiratory: Negative for shortness of breath.  Reports productive cough and congestion. Gastrointestinal: Negative for abdominal pain, vomiting and diarrhea. Genitourinary: Negative for dysuria. Musculoskeletal: Negative for back pain.  Reports generalized body aches. Skin: Negative for rash. Neurological: Negative for headaches, focal weakness or numbness. ____________________________________________  PHYSICAL EXAM:  VITAL SIGNS: ED Triage Vitals  Enc Vitals Group     BP 06/26/18 2007 124/72     Pulse Rate 06/26/18 2007 (!) 120     Resp 06/26/18 2007 20     Temp 06/26/18 2007 (!) 100.7 F (  38.2 C)     Temp Source 06/26/18 2007 Oral     SpO2 06/26/18 2007 97 %     Weight 06/26/18 2007 156 lb 1.4 oz (70.8 kg)     Height --      Head Circumference --      Peak Flow --      Pain Score 06/26/18 2030 9     Pain Loc --      Pain Edu? --      Excl. in GC? --     Constitutional: Alert and oriented. Well appearing and in no distress. Head: Normocephalic and atraumatic. Eyes: Conjunctivae are normal. PERRL. Normal extraocular movements Ears: Canals clear. TMs intact bilaterally. Nose: No congestion/rhinorrhea/epistaxis. Mouth/Throat:  Mucous membranes are moist.  Uvula is midline and tonsils are flat. Neck: Supple. No thyromegaly. Hematological/Lymphatic/Immunological: No cervical lymphadenopathy. Cardiovascular: Normal rate, regular rhythm. Normal distal pulses. Respiratory: Normal respiratory effort. No wheezes/rales.  Mild bilateral rhonchi. Gastrointestinal: Gravid.  Soft and nontender. No distention. ____________________________________________   RADIOLOGY  deferred ____________________________________________  PROCEDURES  Procedures Ondansetron 8 mg ODT Acetaminophen 650 mg p.o. ____________________________________________  INITIAL IMPRESSION / ASSESSMENT AND PLAN / ED COURSE  Patient at [redacted] weeks gestation with a 24-hour complaint of sudden onset of cough, congestion, body aches, and fevers.  Patient's clinical picture is likely representative of an early viral bronchitis or subclinical pneumonia.  We will treat empirically for community-acquired pneumonia and discharged patient with a prescription for a azithromycin.  She will continue to dose over-the-counter Tylenol, Benadryl, and nasal sprays as necessary.  She is encouraged to hydrate and rest in the interim.  A work note is provided for 3 days.  She will see her primary provider or [facility] for ongoing symptom management. ____________________________________________  FINAL CLINICAL IMPRESSION(S) / ED DIAGNOSES  Final diagnoses:  Bronchitis      Karmen Stabs, Charlesetta Ivory, PA-C 06/26/18 2341    Sharyn Creamer, MD 06/26/18 2359

## 2018-06-26 NOTE — Discharge Instructions (Addendum)
You are being treated for a bronchitis, and possible early pneumonia. Take the antibiotic as directed. You may continue to take Tylenol, Benadryl, and nasal sprays are safe in pregnancy. Drink plenty of fluids and rest. Follow-up with your provider for ongoing symptoms. Return to the ED for worsening symptoms.

## 2018-06-26 NOTE — ED Triage Notes (Signed)
Patient ambulatory to triage with steady gait, without difficulty or distress noted, mask in place; here with child also to be seen for same symptoms; pt st since this am having body aches, prod cough green sputum & congestion; EDC 12/7 G4P3, no complications or pregn related c/o; [facility]

## 2018-06-28 ENCOUNTER — Other Ambulatory Visit (INDEPENDENT_AMBULATORY_CARE_PROVIDER_SITE_OTHER): Payer: Medicaid Other

## 2018-06-28 ENCOUNTER — Other Ambulatory Visit: Payer: Self-pay | Admitting: Certified Nurse Midwife

## 2018-06-28 ENCOUNTER — Other Ambulatory Visit: Payer: Medicaid Other

## 2018-06-28 ENCOUNTER — Ambulatory Visit (INDEPENDENT_AMBULATORY_CARE_PROVIDER_SITE_OTHER): Payer: Medicaid Other | Admitting: Certified Nurse Midwife

## 2018-06-28 VITALS — BP 102/68 | HR 81 | Wt 157.0 lb

## 2018-06-28 DIAGNOSIS — O09893 Supervision of other high risk pregnancies, third trimester: Secondary | ICD-10-CM

## 2018-06-28 DIAGNOSIS — Z3482 Encounter for supervision of other normal pregnancy, second trimester: Secondary | ICD-10-CM

## 2018-06-28 DIAGNOSIS — O09213 Supervision of pregnancy with history of pre-term labor, third trimester: Secondary | ICD-10-CM | POA: Diagnosis not present

## 2018-06-28 DIAGNOSIS — R809 Proteinuria, unspecified: Secondary | ICD-10-CM

## 2018-06-28 DIAGNOSIS — Z3A28 28 weeks gestation of pregnancy: Secondary | ICD-10-CM

## 2018-06-28 DIAGNOSIS — Z23 Encounter for immunization: Secondary | ICD-10-CM | POA: Diagnosis not present

## 2018-06-28 DIAGNOSIS — O9989 Other specified diseases and conditions complicating pregnancy, childbirth and the puerperium: Secondary | ICD-10-CM

## 2018-06-28 LAB — POCT URINALYSIS DIPSTICK OB
Blood, UA: NEGATIVE
Glucose, UA: NEGATIVE
Ketones, UA: NEGATIVE
Leukocytes, UA: NEGATIVE
NITRITE UA: NEGATIVE
PH UA: 6 (ref 5.0–8.0)
Spec Grav, UA: >=1.03 — AB (ref 1.010–1.025)
UROBILINOGEN UA: 0.2 U/dL

## 2018-06-28 MED ORDER — TETANUS-DIPHTH-ACELL PERTUSSIS 5-2.5-18.5 LF-MCG/0.5 IM SUSP: 0.5000 mL | Freq: Once | INTRAMUSCULAR | Status: DC

## 2018-06-28 MED ORDER — TETANUS-DIPHTH-ACELL PERTUSSIS 5-2.5-18.5 LF-MCG/0.5 IM SUSP
0.5000 mL | Freq: Once | INTRAMUSCULAR | Status: AC
  Administered 2018-06-28: 0.5 mL via INTRAMUSCULAR

## 2018-06-28 NOTE — Patient Instructions (Signed)
Common Medications Safe in Pregnancy  Acne:      Constipation:  Benzoyl Peroxide     Colace  Clindamycin      Dulcolax Suppository  Topica Erythromycin     Fibercon  Salicylic Acid      Metamucil         Miralax AVOID:        Senakot   Accutane    Cough:  Retin-A       Cough Drops  Tetracycline      Phenergan w/ Codeine if Rx  Minocycline      Robitussin (Plain & DM)  Antibiotics:     Crabs/Lice:  Ceclor       RID  Cephalosporins    AVOID:  E-Mycins      Kwell  Keflex  Macrobid/Macrodantin   Diarrhea:  Penicillin      Kao-Pectate  Zithromax      Imodium AD         PUSH FLUIDS AVOID:       Cipro     Fever:  Tetracycline      Tylenol (Regular or Extra  Minocycline       Strength)  Levaquin      Extra Strength-Do not          Exceed 8 tabs/24 hrs Caffeine:        <200mg/day (equiv. To 1 cup of coffee or  approx. 3 12 oz sodas)         Gas: Cold/Hayfever:       Gas-X  Benadryl      Mylicon  Claritin       Phazyme  **Claritin-D        Chlor-Trimeton    Headaches:  Dimetapp      ASA-Free Excedrin  Drixoral-Non-Drowsy     Cold Compress  Mucinex (Guaifenasin)     Tylenol (Regular or Extra  Sudafed/Sudafed-12 Hour     Strength)  **Sudafed PE Pseudoephedrine   Tylenol Cold & Sinus     Vicks Vapor Rub  Zyrtec  **AVOID if Problems With Blood Pressure         Heartburn: Avoid lying down for at least 1 hour after meals  Aciphex      Maalox     Rash:  Milk of Magnesia     Benadryl    Mylanta       1% Hydrocortisone Cream  Pepcid  Pepcid Complete   Sleep Aids:  Prevacid      Ambien   Prilosec       Benadryl  Rolaids       Chamomile Tea  Tums (Limit 4/day)     Unisom  Zantac       Tylenol PM         Warm milk-add vanilla or  Hemorrhoids:       Sugar for taste  Anusol/Anusol H.C.  (RX: Analapram 2.5%)  Sugar Substitutes:  Hydrocortisone OTC     Ok in moderation  Preparation H      Tucks        Vaseline lotion applied to tissue with  wiping    Herpes:     Throat:  Acyclovir      Oragel  Famvir  Valtrex     Vaccines:         Flu Shot Leg Cramps:       *Gardasil  Benadryl      Hepatitis A         Hepatitis B Nasal Spray:         Pneumovax  Saline Nasal Spray     Polio Booster         Tetanus Nausea:       Tuberculosis test or PPD  Vitamin B6 25 mg TID   AVOID:    Dramamine      *Gardasil  Emetrol       Live Poliovirus  Ginger Root 250 mg QID    MMR (measles, mumps &  High Complex Carbs @ Bedtime    rebella)  Sea Bands-Accupressure    Varicella (Chickenpox)  Unisom 1/2 tab TID     *No known complications           If received before Pain:         Known pregnancy;   Darvocet       Resume series after  Lortab        Delivery  Percocet    Yeast:   Tramadol      Femstat  Tylenol 3      Gyne-lotrimin  Ultram       Monistat  Vicodin           MISC:         All Sunscreens           Hair Coloring/highlights          Insect Repellant's          (Including DEET)         Mystic Tans Third Trimester of Pregnancy The third trimester is from week 29 through week 42, months 7 through 9. This trimester is when your unborn baby (fetus) is growing very fast. At the end of the ninth month, the unborn baby is about 20 inches in length. It weighs about 6-10 pounds. Follow these instructions at home:  Avoid all smoking, herbs, and alcohol. Avoid drugs not approved by your doctor.  Do not use any tobacco products, including cigarettes, chewing tobacco, and electronic cigarettes. If you need help quitting, ask your doctor. You may get counseling or other support to help you quit.  Only take medicine as told by your doctor. Some medicines are safe and some are not during pregnancy.  Exercise only as told by your doctor. Stop exercising if you start having cramps.  Eat regular, healthy meals.  Wear a good support bra if your breasts are tender.  Do not use hot tubs, steam rooms, or saunas.  Wear your seat belt when  driving.  Avoid raw meat, uncooked cheese, and liter boxes and soil used by cats.  Take your prenatal vitamins.  Take 1500-2000 milligrams of calcium daily starting at the 20th week of pregnancy until you deliver your baby.  Try taking medicine that helps you poop (stool softener) as needed, and if your doctor approves. Eat more fiber by eating fresh fruit, vegetables, and whole grains. Drink enough fluids to keep your pee (urine) clear or pale yellow.  Take warm water baths (sitz baths) to soothe pain or discomfort caused by hemorrhoids. Use hemorrhoid cream if your doctor approves.  If you have puffy, bulging veins (varicose veins), wear support hose. Raise (elevate) your feet for 15 minutes, 3-4 times a day. Limit salt in your diet.  Avoid heavy lifting, wear low heels, and sit up straight.  Rest with your legs raised if you have leg cramps or low back pain.  Visit your dentist if you have not gone during your pregnancy. Use a soft toothbrush to brush your teeth. Be gentle when you floss.    You can have sex (intercourse) unless your doctor tells you not to.  Do not travel far distances unless you must. Only do so with your doctor's approval.  Take prenatal classes.  Practice driving to the hospital.  Pack your hospital bag.  Prepare the baby's room.  Go to your doctor visits. Get help if:  You are not sure if you are in labor or if your water has broken.  You are dizzy.  You have mild cramps or pressure in your lower belly (abdominal).  You have a nagging pain in your belly area.  You continue to feel sick to your stomach (nauseous), throw up (vomit), or have watery poop (diarrhea).  You have bad smelling fluid coming from your vagina.  You have pain with peeing (urination). Get help right away if:  You have a fever.  You are leaking fluid from your vagina.  You are spotting or bleeding from your vagina.  You have severe belly cramping or pain.  You lose or  gain weight rapidly.  You have trouble catching your breath and have chest pain.  You notice sudden or extreme puffiness (swelling) of your face, hands, ankles, feet, or legs.  You have not felt the baby move in over an hour.  You have severe headaches that do not go away with medicine.  You have vision changes. This information is not intended to replace advice given to you by your health care provider. Make sure you discuss any questions you have with your health care provider. Document Released: 12/21/2009 Document Revised: 03/03/2016 Document Reviewed: 11/27/2012 Elsevier Interactive Patient Education  2017 Elsevier Inc.  

## 2018-06-28 NOTE — Progress Notes (Signed)
ROB-28 week labs today. Proteinuria, will send culture; see orders. Blood transfusion consent reviewed and signed. Growth US today, WNL; findings reviewed. TDaP/Flu given. Plans epidural, breast/formula, and Mirena as postpartum contraception. Third trimester education; see AVS. Meeting with Jola BabinskiMarilyn, Pregnancy Case Manager, PHQ-9: 15. Declines counseling or medication at this time. Reviewed red flag symptoms and when to call. RTC x 2 weeks for ROB or sooner if needed.   ULTRASOUND REPORT  Location: [facility] Date of Service:  06/28/2018  Indications: Growth; Hx preterm delivery Findings:  Singleton intrauterine pregnancy is visualized with FHR at 126 BPM. Biometrics give an (U/S) Gestational age of 30 4/7 weeks and an (U/S) EDD of 09/16/18; this correlates with the clinically established EDD of 09/15/18.  Fetal presentation is vertex.  EFW: 1245 grams (2lb 12oz).  45th percentile. Placenta: Anterior and grade 2. AFI: 8.0 cm.  Fetal stomach, kidneys, and bladder appear WNL.  Impression: 1. 28 4/7 week Viable Singleton Intrauterine pregnancy by U/S. 2. (U/S) EDD is consistent with Clinically established (LMP) EDD of 09/15/18. 3. EFW: 1245 grams (2lb 12oz).  45th percentile.  Recommendations: 1.Clinical correlation with the patient's History and Physical Exam.

## 2018-06-28 NOTE — Progress Notes (Signed)
Pt presents today for ROB and 28 week labs. Pt will receive Tdap today and sign Blood Tranfusion Consent Form. PHQ-9 completed by Jola BabinskiMarilyn, Pt score is 15.

## 2018-06-30 LAB — URINE CULTURE: ORGANISM ID, BACTERIA: NO GROWTH

## 2018-06-30 LAB — CBC
Hematocrit: 35.3 % (ref 34.0–46.6)
Hemoglobin: 11.6 g/dL (ref 11.1–15.9)
MCH: 32.2 pg (ref 26.6–33.0)
MCHC: 32.9 g/dL (ref 31.5–35.7)
MCV: 98 fL — AB (ref 79–97)
PLATELETS: 169 10*3/uL (ref 150–450)
RBC: 3.6 x10E6/uL — AB (ref 3.77–5.28)
RDW: 14.1 % (ref 12.3–15.4)
WBC: 3.3 10*3/uL — AB (ref 3.4–10.8)

## 2018-06-30 LAB — GLUCOSE, 1 HOUR GESTATIONAL: GESTATIONAL DIABETES SCREEN: 79 mg/dL (ref 65–139)

## 2018-06-30 LAB — RPR: RPR Ser Ql: NONREACTIVE

## 2018-07-06 ENCOUNTER — Telehealth: Payer: Self-pay

## 2018-07-06 NOTE — Telephone Encounter (Signed)
Pt informed of neg urine culture per ML.

## 2018-07-12 ENCOUNTER — Ambulatory Visit (INDEPENDENT_AMBULATORY_CARE_PROVIDER_SITE_OTHER): Payer: Medicaid Other | Admitting: Obstetrics and Gynecology

## 2018-07-12 VITALS — BP 110/74 | HR 98 | Wt 161.8 lb

## 2018-07-12 DIAGNOSIS — Z3493 Encounter for supervision of normal pregnancy, unspecified, third trimester: Secondary | ICD-10-CM

## 2018-07-12 LAB — POCT URINALYSIS DIPSTICK OB
Bilirubin, UA: NEGATIVE
Blood, UA: NEGATIVE
Glucose, UA: NEGATIVE
KETONES UA: NEGATIVE
Leukocytes, UA: NEGATIVE
NITRITE UA: NEGATIVE
PH UA: 6 (ref 5.0–8.0)
SPEC GRAV UA: 1.015 (ref 1.010–1.025)
UROBILINOGEN UA: 0.2 U/dL

## 2018-07-12 NOTE — Progress Notes (Signed)
ROB- pt is doing well 

## 2018-07-12 NOTE — Progress Notes (Signed)
ROB- doing well, PTL precautions discussed 

## 2018-07-26 ENCOUNTER — Ambulatory Visit (INDEPENDENT_AMBULATORY_CARE_PROVIDER_SITE_OTHER): Payer: Medicaid Other | Admitting: Certified Nurse Midwife

## 2018-07-26 ENCOUNTER — Other Ambulatory Visit (HOSPITAL_COMMUNITY)
Admission: RE | Admit: 2018-07-26 | Discharge: 2018-07-26 | Disposition: A | Discharge status: Home / Self Care | Payer: Medicaid Other | Source: Ambulatory Visit | Attending: Certified Nurse Midwife | Admitting: Certified Nurse Midwife

## 2018-07-26 ENCOUNTER — Other Ambulatory Visit: Payer: Self-pay | Admitting: Certified Nurse Midwife

## 2018-07-26 VITALS — BP 106/68 | HR 76 | Wt 166.6 lb

## 2018-07-26 DIAGNOSIS — O09213 Supervision of pregnancy with history of pre-term labor, third trimester: Secondary | ICD-10-CM

## 2018-07-26 DIAGNOSIS — B373 Candidiasis of vulva and vagina: Secondary | ICD-10-CM | POA: Insufficient documentation

## 2018-07-26 DIAGNOSIS — Z3A00 Weeks of gestation of pregnancy not specified: Secondary | ICD-10-CM | POA: Diagnosis not present

## 2018-07-26 DIAGNOSIS — O26893 Other specified pregnancy related conditions, third trimester: Secondary | ICD-10-CM

## 2018-07-26 DIAGNOSIS — Z3493 Encounter for supervision of normal pregnancy, unspecified, third trimester: Secondary | ICD-10-CM

## 2018-07-26 DIAGNOSIS — N898 Other specified noninflammatory disorders of vagina: Secondary | ICD-10-CM

## 2018-07-26 DIAGNOSIS — O09893 Supervision of other high risk pregnancies, third trimester: Secondary | ICD-10-CM

## 2018-07-26 NOTE — Progress Notes (Signed)
ROB, c/o mucous vaginal discharge x2 weeks.

## 2018-07-26 NOTE — Progress Notes (Signed)
ROB-Reports pelvic pressure and increased vaginal discharge x 2 weeks. Feels like she has started "nesting" and is concerned that baby will come early. Denies intercourse in the last two (2) weeks. FFN and vaginal swab collected, will contact patient with results. SVE: cl/th/-3, vertex. Discussed home treatment measures including use of abdominal support. Reviewed red flag symptoms and when to call. RTC x 2 weeks for ROB or sooner if needed.

## 2018-07-26 NOTE — Patient Instructions (Signed)
Common Medications Safe in Pregnancy  Acne:      Constipation:  Benzoyl Peroxide     Colace  Clindamycin      Dulcolax Suppository  Topica Erythromycin     Fibercon  Salicylic Acid      Metamucil         Miralax AVOID:        Senakot   Accutane    Cough:  Retin-A       Cough Drops  Tetracycline      Phenergan w/ Codeine if Rx  Minocycline      Robitussin (Plain & DM)  Antibiotics:     Crabs/Lice:  Ceclor       RID  Cephalosporins    AVOID:  E-Mycins      Kwell  Keflex  Macrobid/Macrodantin   Diarrhea:  Penicillin      Kao-Pectate  Zithromax      Imodium AD         PUSH FLUIDS AVOID:       Cipro     Fever:  Tetracycline      Tylenol (Regular or Extra  Minocycline       Strength)  Levaquin      Extra Strength-Do not          Exceed 8 tabs/24 hrs Caffeine:        <200mg/day (equiv. To 1 cup of coffee or  approx. 3 12 oz sodas)         Gas: Cold/Hayfever:       Gas-X  Benadryl      Mylicon  Claritin       Phazyme  **Claritin-D        Chlor-Trimeton    Headaches:  Dimetapp      ASA-Free Excedrin  Drixoral-Non-Drowsy     Cold Compress  Mucinex (Guaifenasin)     Tylenol (Regular or Extra  Sudafed/Sudafed-12 Hour     Strength)  **Sudafed PE Pseudoephedrine   Tylenol Cold & Sinus     Vicks Vapor Rub  Zyrtec  **AVOID if Problems With Blood Pressure         Heartburn: Avoid lying down for at least 1 hour after meals  Aciphex      Maalox     Rash:  Milk of Magnesia     Benadryl    Mylanta       1% Hydrocortisone Cream  Pepcid  Pepcid Complete   Sleep Aids:  Prevacid      Ambien   Prilosec       Benadryl  Rolaids       Chamomile Tea  Tums (Limit 4/day)     Unisom  Zantac       Tylenol PM         Warm milk-add vanilla or  Hemorrhoids:       Sugar for taste  Anusol/Anusol H.C.  (RX: Analapram 2.5%)  Sugar Substitutes:  Hydrocortisone OTC     Ok in moderation  Preparation H      Tucks        Vaseline lotion applied to tissue with  wiping    Herpes:     Throat:  Acyclovir      Oragel  Famvir  Valtrex     Vaccines:         Flu Shot Leg Cramps:       *Gardasil  Benadryl      Hepatitis A         Hepatitis B Nasal Spray:         Pneumovax  Saline Nasal Spray     Polio Booster         Tetanus Nausea:       Tuberculosis test or PPD  Vitamin B6 25 mg TID   AVOID:    Dramamine      *Gardasil  Emetrol       Live Poliovirus  Ginger Root 250 mg QID    MMR (measles, mumps &  High Complex Carbs @ Bedtime    rebella)  Sea Bands-Accupressure    Varicella (Chickenpox)  Unisom 1/2 tab TID     *No known complications           If received before Pain:         Known pregnancy;   Darvocet       Resume series after  Lortab        Delivery  Percocet    Yeast:   Tramadol      Femstat  Tylenol 3      Gyne-lotrimin  Ultram       Monistat  Vicodin           MISC:         All Sunscreens           Hair Coloring/highlights          Insect Repellant's          (Including DEET)         Mystic Tans Third Trimester of Pregnancy The third trimester is from week 29 through week 42, months 7 through 9. This trimester is when your unborn baby (fetus) is growing very fast. At the end of the ninth month, the unborn baby is about 20 inches in length. It weighs about 6-10 pounds. Follow these instructions at home:  Avoid all smoking, herbs, and alcohol. Avoid drugs not approved by your doctor.  Do not use any tobacco products, including cigarettes, chewing tobacco, and electronic cigarettes. If you need help quitting, ask your doctor. You may get counseling or other support to help you quit.  Only take medicine as told by your doctor. Some medicines are safe and some are not during pregnancy.  Exercise only as told by your doctor. Stop exercising if you start having cramps.  Eat regular, healthy meals.  Wear a good support bra if your breasts are tender.  Do not use hot tubs, steam rooms, or saunas.  Wear your seat belt when  driving.  Avoid raw meat, uncooked cheese, and liter boxes and soil used by cats.  Take your prenatal vitamins.  Take 1500-2000 milligrams of calcium daily starting at the 20th week of pregnancy until you deliver your baby.  Try taking medicine that helps you poop (stool softener) as needed, and if your doctor approves. Eat more fiber by eating fresh fruit, vegetables, and whole grains. Drink enough fluids to keep your pee (urine) clear or pale yellow.  Take warm water baths (sitz baths) to soothe pain or discomfort caused by hemorrhoids. Use hemorrhoid cream if your doctor approves.  If you have puffy, bulging veins (varicose veins), wear support hose. Raise (elevate) your feet for 15 minutes, 3-4 times a day. Limit salt in your diet.  Avoid heavy lifting, wear low heels, and sit up straight.  Rest with your legs raised if you have leg cramps or low back pain.  Visit your dentist if you have not gone during your pregnancy. Use a soft toothbrush to brush your teeth. Be gentle when you floss.    You can have sex (intercourse) unless your doctor tells you not to.  Do not travel far distances unless you must. Only do so with your doctor's approval.  Take prenatal classes.  Practice driving to the hospital.  Pack your hospital bag.  Prepare the baby's room.  Go to your doctor visits. Get help if:  You are not sure if you are in labor or if your water has broken.  You are dizzy.  You have mild cramps or pressure in your lower belly (abdominal).  You have a nagging pain in your belly area.  You continue to feel sick to your stomach (nauseous), throw up (vomit), or have watery poop (diarrhea).  You have bad smelling fluid coming from your vagina.  You have pain with peeing (urination). Get help right away if:  You have a fever.  You are leaking fluid from your vagina.  You are spotting or bleeding from your vagina.  You have severe belly cramping or pain.  You lose or  gain weight rapidly.  You have trouble catching your breath and have chest pain.  You notice sudden or extreme puffiness (swelling) of your face, hands, ankles, feet, or legs.  You have not felt the baby move in over an hour.  You have severe headaches that do not go away with medicine.  You have vision changes. This information is not intended to replace advice given to you by your health care provider. Make sure you discuss any questions you have with your health care provider. Document Released: 12/21/2009 Document Revised: 03/03/2016 Document Reviewed: 11/27/2012 Elsevier Interactive Patient Education  2017 Elsevier Inc.  

## 2018-07-30 ENCOUNTER — Other Ambulatory Visit: Payer: Self-pay | Admitting: Certified Nurse Midwife

## 2018-07-30 LAB — CERVICOVAGINAL ANCILLARY ONLY
BACTERIAL VAGINITIS: NEGATIVE
Candida vaginitis: POSITIVE — AB

## 2018-07-30 MED ORDER — TERCONAZOLE 0.4 % VA CREA: 1.0000 | TOPICAL_CREAM | Freq: Every day | VAGINAL | 0 refills | Status: DC

## 2018-07-31 ENCOUNTER — Telehealth: Payer: Self-pay | Admitting: Obstetrics and Gynecology

## 2018-07-31 NOTE — Telephone Encounter (Signed)
The patient called and stated that her mucus plug fell out and she would like to speak with a nurse and come into the office today. The patient stated that she would like a detailed message left on her phone if no one picks up. Please advise.

## 2018-07-31 NOTE — Telephone Encounter (Signed)
Spoke with pt she voiced understanding 

## 2018-08-01 LAB — FETAL FIBRONECTIN: Fetal Fibronectin: NEGATIVE

## 2018-08-02 ENCOUNTER — Telehealth: Payer: Self-pay | Admitting: Obstetrics and Gynecology

## 2018-08-02 NOTE — Progress Notes (Signed)
Please let patient know that FFN was negative. Encourage to activate MyChart. Thanks, JML

## 2018-08-02 NOTE — Telephone Encounter (Signed)
The patient called and stated that she just missed a call from someone, I looked into her chart but was unable to find out who called the patient. Please advise.

## 2018-08-03 NOTE — Telephone Encounter (Signed)
havent called pt today

## 2018-08-10 ENCOUNTER — Encounter: Payer: Medicaid Other | Admitting: Certified Nurse Midwife

## 2018-08-17 ENCOUNTER — Ambulatory Visit (INDEPENDENT_AMBULATORY_CARE_PROVIDER_SITE_OTHER): Payer: Medicaid Other | Admitting: Certified Nurse Midwife

## 2018-08-17 VITALS — BP 115/70 | HR 82 | Wt 167.5 lb

## 2018-08-17 DIAGNOSIS — Z3493 Encounter for supervision of normal pregnancy, unspecified, third trimester: Secondary | ICD-10-CM

## 2018-08-17 LAB — POCT URINALYSIS DIPSTICK OB
Bilirubin, UA: NEGATIVE
Glucose, UA: NEGATIVE
KETONES UA: NEGATIVE
Leukocytes, UA: NEGATIVE
NITRITE UA: NEGATIVE
PH UA: 5 (ref 5.0–8.0)
PROTEIN: NEGATIVE
RBC UA: NEGATIVE
Spec Grav, UA: <=1.005 — AB (ref 1.010–1.025)
Urobilinogen, UA: 0.2 E.U./dL

## 2018-08-17 NOTE — Patient Instructions (Signed)
Group B Streptococcus Infection During Pregnancy Group B Streptococcus (GBS) is a type of bacteria (Streptococcus agalactiae) that is often found in healthy people, commonly in the rectum, vagina, and intestines. In people who are healthy and not pregnant, the bacteria rarely cause serious illness or complications. However, women who test positive for GBS during pregnancy can pass the bacteria to their baby during childbirth, which can cause serious infection in the baby after birth. Women with GBS may also have infections during their pregnancy or immediately after childbirth, such as such as urinary tract infections (UTIs) or infections of the uterus (uterine infections). Having GBS also increases a woman's risk of complications during pregnancy, such as early (preterm) labor or delivery, miscarriage, or stillbirth. Routine testing (screening) for GBS is recommended for all pregnant women. What increases the risk? You may have a higher risk for GBS infection during pregnancy if you had one during a past pregnancy. What are the signs or symptoms? In most cases, GBS infection does not cause symptoms in pregnant women. Signs and symptoms of a possible GBS-related infection may include:  Labor starting before the 37th week of pregnancy.  A UTI or bladder infection, which may cause: ? Fever. ? Pain or burning during urination. ? Frequent urination.  Fever during labor, along with: ? Bad-smelling discharge. ? Uterine tenderness. ? Rapid heartbeat in the mother, baby, or both.  Rare but serious symptoms of a possible GBS-related infection in women include:  Blood infection (septicemia). This may cause fever, chills, or confusion.  Lung infection (pneumonia). This may cause fever, chills, cough, rapid breathing, difficulty breathing, or chest pain.  Bone, joint, skin, or soft tissue infection.  How is this diagnosed? You may be screened for GBS between week 35 and week 37 of your pregnancy. If  you have symptoms of preterm labor, you may be screened earlier. This condition is diagnosed based on lab test results from:  A swab of fluid from the vagina and rectum.  A urine sample.  How is this treated? This condition is treated with antibiotic medicine. When you go into labor, or as soon as your water breaks (your membranes rupture), you will be given antibiotics through an IV tube. Antibiotics will continue until after you give birth. If you are having a cesarean delivery, you do not need antibiotics unless your membranes have already ruptured. Follow these instructions at home:  Take over-the-counter and prescription medicines only as told by your health care provider.  Take your antibiotic medicine as told by your health care provider. Do not stop taking the antibiotic even if you start to feel better.  Keep all pre-birth (prenatal) visits and follow-up visits as told by your health care provider. This is important. Contact a health care provider if:  You have pain or burning when you urinate.  You have to urinate frequently.  You have a fever or chills.  You develop a bad-smelling vaginal discharge. Get help right away if:  Your membranes rupture.  You go into labor.  You have severe pain in your abdomen.  You have difficulty breathing.  You have chest pain. This information is not intended to replace advice given to you by your health care provider. Make sure you discuss any questions you have with your health care provider. Document Released: 01/03/2008 Document Revised: 04/22/2016 Document Reviewed: 04/21/2016 Elsevier Interactive Patient Education  2018 [employer]  

## 2018-08-17 NOTE — Progress Notes (Signed)
ROB, doing well. Continues to have some pelvic pressure. Reassurance given. She also complains of round ligament pain. GBs and cultures today. SVE per pt.  request 1-2/60/-2. Will follow up with GBS results. Return in 1 wk with Melody.   Doreene Burke, CNM

## 2018-08-19 LAB — GC/CHLAMYDIA PROBE AMP
CHLAMYDIA, DNA PROBE: NEGATIVE
Neisseria gonorrhoeae by PCR: NEGATIVE

## 2018-08-19 LAB — STREP GP B NAA: STREP GROUP B AG: NEGATIVE

## 2018-08-20 ENCOUNTER — Telehealth: Payer: Self-pay

## 2018-08-20 NOTE — Telephone Encounter (Signed)
Line was busy - unable to leave message.

## 2018-08-20 NOTE — Telephone Encounter (Signed)
Unable to leave message

## 2018-08-21 ENCOUNTER — Telehealth: Payer: Self-pay

## 2018-08-21 NOTE — Telephone Encounter (Signed)
Line is busy unable to leave message

## 2018-08-24 ENCOUNTER — Encounter: Payer: Medicaid Other | Admitting: Obstetrics and Gynecology

## 2018-08-28 ENCOUNTER — Encounter: Payer: Self-pay | Admitting: Emergency Medicine

## 2018-08-28 ENCOUNTER — Inpatient Hospital Stay (EMERGENCY_DEPARTMENT_HOSPITAL)
Admission: EM | Admit: 2018-08-28 | Discharge: 2018-08-28 | Disposition: A | Discharge status: Home / Self Care | Payer: Medicaid Other | Source: Home / Self Care | Admitting: Obstetrics and Gynecology

## 2018-08-28 ENCOUNTER — Emergency Department
Admission: EM | Admit: 2018-08-28 | Discharge: 2018-08-28 | Disposition: A | Discharge status: Home / Self Care | Payer: Medicaid Other | Attending: Emergency Medicine | Admitting: Emergency Medicine

## 2018-08-28 ENCOUNTER — Emergency Department: Payer: Medicaid Other

## 2018-08-28 ENCOUNTER — Other Ambulatory Visit: Payer: Self-pay

## 2018-08-28 DIAGNOSIS — Y998 Other external cause status: Secondary | ICD-10-CM | POA: Insufficient documentation

## 2018-08-28 DIAGNOSIS — O26893 Other specified pregnancy related conditions, third trimester: Secondary | ICD-10-CM | POA: Insufficient documentation

## 2018-08-28 DIAGNOSIS — O99343 Other mental disorders complicating pregnancy, third trimester: Secondary | ICD-10-CM | POA: Insufficient documentation

## 2018-08-28 DIAGNOSIS — F419 Anxiety disorder, unspecified: Secondary | ICD-10-CM

## 2018-08-28 DIAGNOSIS — R1033 Periumbilical pain: Secondary | ICD-10-CM | POA: Diagnosis not present

## 2018-08-28 DIAGNOSIS — O9A213 Injury, poisoning and certain other consequences of external causes complicating pregnancy, third trimester: Secondary | ICD-10-CM | POA: Diagnosis not present

## 2018-08-28 DIAGNOSIS — Y92410 Unspecified street and highway as the place of occurrence of the external cause: Secondary | ICD-10-CM | POA: Insufficient documentation

## 2018-08-28 DIAGNOSIS — Z3A37 37 weeks gestation of pregnancy: Secondary | ICD-10-CM | POA: Insufficient documentation

## 2018-08-28 DIAGNOSIS — Z3A01 Less than 8 weeks gestation of pregnancy: Secondary | ICD-10-CM | POA: Insufficient documentation

## 2018-08-28 DIAGNOSIS — O9989 Other specified diseases and conditions complicating pregnancy, childbirth and the puerperium: Secondary | ICD-10-CM | POA: Diagnosis not present

## 2018-08-28 DIAGNOSIS — O9A211 Injury, poisoning and certain other consequences of external causes complicating pregnancy, first trimester: Secondary | ICD-10-CM | POA: Insufficient documentation

## 2018-08-28 DIAGNOSIS — Y9389 Activity, other specified: Secondary | ICD-10-CM | POA: Diagnosis not present

## 2018-08-28 DIAGNOSIS — S46919A Strain of unspecified muscle, fascia and tendon at shoulder and upper arm level, unspecified arm, initial encounter: Secondary | ICD-10-CM

## 2018-08-28 DIAGNOSIS — O99341 Other mental disorders complicating pregnancy, first trimester: Secondary | ICD-10-CM | POA: Diagnosis not present

## 2018-08-28 DIAGNOSIS — S161XXA Strain of muscle, fascia and tendon at neck level, initial encounter: Secondary | ICD-10-CM | POA: Insufficient documentation

## 2018-08-28 DIAGNOSIS — F329 Major depressive disorder, single episode, unspecified: Secondary | ICD-10-CM

## 2018-08-28 MED ORDER — ACETAMINOPHEN 325 MG PO TABS
ORAL_TABLET | ORAL | Status: AC
  Administered 2018-08-28: 650 mg via ORAL
  Filled 2018-08-28: qty 2

## 2018-08-28 MED ORDER — ACETAMINOPHEN 325 MG PO TABS
650.0000 mg | ORAL_TABLET | Freq: Once | ORAL | Status: AC
  Administered 2018-08-28: 650 mg via ORAL

## 2018-08-28 MED ORDER — ACETAMINOPHEN 500 MG PO TABS: 500.0000 mg | ORAL_TABLET | Freq: Four times a day (QID) | ORAL | Status: DC | PRN

## 2018-08-28 NOTE — Discharge Instructions (Signed)
You may continue to take Tylenol for your neck and upper shoulder pain.  In addition, you may use a heating pad for 15 minutes every hour to help with pain.  Return to the emergency department if you develop severe pain, lightheadedness or fainting, gush of fluid or vaginal bleeding, decreased or absent fetal movement, or any other symptoms concerning to you.

## 2018-08-28 NOTE — Discharge Summary (Signed)
Discharge instructions reviewed with pt and significant order. Pt is to call the OB providers office to make an appt to be seen tomorrow. Questions encouraged and answered appropriately. Pt verbalized understanding of discharge instructions.
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
## 2018-08-28 NOTE — ED Notes (Signed)
Pt will be discharged from ED and sent to L&D for obs.
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
## 2018-08-29 ENCOUNTER — Ambulatory Visit (INDEPENDENT_AMBULATORY_CARE_PROVIDER_SITE_OTHER): Payer: Medicaid Other | Admitting: Obstetrics and Gynecology

## 2018-08-29 VITALS — BP 105/67 | HR 68 | Wt 169.0 lb

## 2018-08-29 DIAGNOSIS — Z3493 Encounter for supervision of normal pregnancy, unspecified, third trimester: Secondary | ICD-10-CM

## 2018-08-29 LAB — POCT URINALYSIS DIPSTICK OB
BILIRUBIN UA: NEGATIVE
GLUCOSE, UA: NEGATIVE
KETONES UA: NEGATIVE
Nitrite, UA: NEGATIVE
RBC UA: NEGATIVE
SPEC GRAV UA: 1.01 (ref 1.010–1.025)
Urobilinogen, UA: 0.2 E.U./dL
pH, UA: 6.5 (ref 5.0–8.0)
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
## 2018-08-29 NOTE — Progress Notes (Signed)
ROB- pt is having some pelvic pressure 

## 2018-08-30 ENCOUNTER — Observation Stay
Admission: EM | Admit: 2018-08-30 | Discharge: 2018-08-30 | Disposition: A | Discharge status: Home / Self Care | Payer: Medicaid Other | Attending: Certified Nurse Midwife | Admitting: Certified Nurse Midwife

## 2018-08-30 ENCOUNTER — Other Ambulatory Visit: Payer: Self-pay

## 2018-08-30 DIAGNOSIS — Z3A37 37 weeks gestation of pregnancy: Secondary | ICD-10-CM | POA: Diagnosis not present

## 2018-08-30 DIAGNOSIS — O471 False labor at or after 37 completed weeks of gestation: Secondary | ICD-10-CM | POA: Diagnosis not present

## 2018-08-30 DIAGNOSIS — Z3483 Encounter for supervision of other normal pregnancy, third trimester: Secondary | ICD-10-CM | POA: Diagnosis not present

## 2018-08-30 MED ORDER — LACTATED RINGERS IV SOLN: INTRAVENOUS | Status: DC

## 2018-08-30 MED ORDER — OXYTOCIN 40 UNITS IN LACTATED RINGERS INFUSION - SIMPLE MED: 2.5000 [IU]/h | INTRAVENOUS | Status: DC

## 2018-08-30 MED ORDER — OXYTOCIN BOLUS FROM INFUSION: 500.0000 mL | Freq: Once | INTRAVENOUS | Status: DC

## 2018-08-30 NOTE — OB Triage Note (Signed)
   L&D OB Triage Note  SUBJECTIVE Hayley Kirk is a 32 y.o. [id number] female at 6649w5d, EDD Estimated Date of Delivery: 09/15/18 who presented to triage with complaints of contractions. She feels good movement and denies loss of fluid or bleeding. .   OB History  Gravida Para Term Preterm AB Living  7 4 3 1 2 4   SAB TAB Ectopic Multiple Live Births  1 1 0 0 4    # Outcome Date GA Lbr Len/2nd Weight Sex Delivery Anes PTL Lv  7 Current           6 Term 05/08/16 10064w2d / 00:09 3860 g F Vag-Spont EPI  LIV     Name: [name],GIRL Casmira     Apgar1: 8  Apgar5: 9  5 TAB 02/2015 6467w0d       FD  4 SAB 2014 6581w0d       FD  3 Term 03/18/12 4452w0d  2722 g M Vag-Spont   LIV  2 Preterm 06/09/09 782w0d  2268 g M Vag-Spont  Y LIV  1 Term 04/19/08 2828w0d  3175 g F Vag-Spont  N LIV    Obstetric Comments  Pt told after miscarriage that she could not have any more children "punch a hole in cervix?"    Medications Prior to Admission  Medication Sig Dispense Refill Last Dose  . Prenatal Vit-Fe Fumarate-FA (PRENATAL MULTIVITAMIN) TABS tablet Take 1 tablet by mouth daily at 12 noon.   08/30/2018 at Unknown time  . docusate sodium (COLACE) 100 MG capsule Take 1 capsule (100 mg total) by mouth 2 (two) times daily. (Patient not taking: Reported on 08/28/2018) 60 capsule 2 Not Taking     OBJECTIVE  Nursing Evaluation:   BP 109/62 (BP Location: Right Arm)   Pulse 82   Temp 98.1 F (36.7 C) (Oral)   Resp 16   Ht 5\' 4"  (1.626 m)   Wt 76.7 kg   LMP 12/09/2017   SpO2 98%   BMI 29.01 kg/m    Findings: false labor   NST was performed and has been reviewed by me.  NST INTERPRETATION: Category I  Mode: External Baseline Rate (A): 125 bpm Variability: Moderate Accelerations: 15 x 15 Decelerations: None     Contraction Frequency (min): x1  ASSESSMENT Impression:  1.  Pregnancy:  [id number] at 6949w5d , EDD Estimated Date of Delivery: 09/15/18 2.  NST:  Category I  PLAN 1. Reassurance given 2. Discharge  home with standard labor precautions given to return to L&D or call the office for problems. 3. Continue routine prenatal care.    Doreene BurkeAnnie Naziyah Tieszen, CNM

## 2018-08-30 NOTE — Discharge Instructions (Signed)
Return to hospital for increase in painful contractions, leaking of fluid like your water broke, vaginal bleeding, or decrease in fetal movement. You may call the office tomorrow for follow up if you feel that you need to be seen sooner than your regularly scheduled appointment. Drink plenty of water, at least 8 glasses/day. May take tylenol or tylenol pm as directed on package as needed for pain.

## 2018-08-30 NOTE — OB Triage Note (Signed)
Patient arrived in triage with c/o contractions all day that increased in intensity around 2100. Unable to give time frame of how often contractions are occurring. Reports good fetal movement. Reports some small "wetness" noted today, but did not have any continuous leaking or need to wear a pad. Reports mucou/ bloody discharge noted. EFM applied and assessing. Fetal movement palpated while applying EFM. Abdomen palpates soft.

## 2018-08-31 NOTE — OB Triage Note (Signed)
Patient given discharge instructions and verbalized understanding. All questions answered. Patient discharged ambulatory in stable condition accompanied by significant other and RN.

## 2018-09-10 ENCOUNTER — Inpatient Hospital Stay
Admission: EM | Admit: 2018-09-10 | Discharge: 2018-09-11 | DRG: 807 | Disposition: A | Discharge status: Home / Self Care | Payer: Medicaid Other | Attending: Certified Nurse Midwife | Admitting: Certified Nurse Midwife

## 2018-09-10 ENCOUNTER — Inpatient Hospital Stay: Payer: Medicaid Other | Admitting: Anesthesiology

## 2018-09-10 ENCOUNTER — Encounter: Payer: Medicaid Other | Admitting: Certified Nurse Midwife

## 2018-09-10 ENCOUNTER — Other Ambulatory Visit: Payer: Self-pay

## 2018-09-10 DIAGNOSIS — O4202 Full-term premature rupture of membranes, onset of labor within 24 hours of rupture: Secondary | ICD-10-CM

## 2018-09-10 DIAGNOSIS — Z3A39 39 weeks gestation of pregnancy: Secondary | ICD-10-CM

## 2018-09-10 DIAGNOSIS — Z3483 Encounter for supervision of other normal pregnancy, third trimester: Secondary | ICD-10-CM | POA: Diagnosis present

## 2018-09-10 LAB — CBC
HCT: 38 % (ref 36.0–46.0)
Hemoglobin: 12.5 g/dL (ref 12.0–15.0)
MCH: 32.1 pg (ref 26.0–34.0)
MCHC: 32.9 g/dL (ref 30.0–36.0)
MCV: 97.7 fL (ref 80.0–100.0)
Platelets: 133 10*3/uL — ABNORMAL LOW (ref 150–400)
RBC: 3.89 MIL/uL (ref 3.87–5.11)
RDW: 13.3 % (ref 11.5–15.5)
WBC: 7.2 10*3/uL (ref 4.0–10.5)
nRBC: 0.3 % — ABNORMAL HIGH (ref 0.0–0.2)

## 2018-09-10 LAB — TYPE AND SCREEN
ABO/RH(D): A POS
ANTIBODY SCREEN: NEGATIVE

## 2018-09-10 MED ORDER — PHENYLEPHRINE 40 MCG/ML (10ML) SYRINGE FOR IV PUSH (FOR BLOOD PRESSURE SUPPORT): 80.0000 ug | PREFILLED_SYRINGE | INTRAVENOUS | Status: DC | PRN

## 2018-09-10 MED ORDER — LACTATED RINGERS IV SOLN: 500.0000 mL | INTRAVENOUS | Status: DC | PRN

## 2018-09-10 MED ORDER — ONDANSETRON HCL 4 MG/2ML IJ SOLN: 4.0000 mg | INTRAMUSCULAR | Status: DC | PRN

## 2018-09-10 MED ORDER — DIPHENHYDRAMINE HCL 50 MG/ML IJ SOLN: 12.5000 mg | INTRAMUSCULAR | Status: DC | PRN

## 2018-09-10 MED ORDER — LACTATED RINGERS IV SOLN
INTRAVENOUS | Status: DC
  Administered 2018-09-10: 04:00:00 via INTRAVENOUS

## 2018-09-10 MED ORDER — EPHEDRINE 5 MG/ML INJ
10.0000 mg | INTRAVENOUS | Status: DC | PRN
  Filled 2018-09-10: qty 2

## 2018-09-10 MED ORDER — IBUPROFEN 600 MG PO TABS
ORAL_TABLET | ORAL | Status: AC
  Filled 2018-09-10: qty 1

## 2018-09-10 MED ORDER — LACTATED RINGERS IV SOLN: 500.0000 mL | Freq: Once | INTRAVENOUS | Status: DC

## 2018-09-10 MED ORDER — IBUPROFEN 600 MG PO TABS
600.0000 mg | ORAL_TABLET | Freq: Four times a day (QID) | ORAL | Status: DC
  Administered 2018-09-10 – 2018-09-11 (×3): 600 mg via ORAL
  Filled 2018-09-10 (×4): qty 1

## 2018-09-10 MED ORDER — SIMETHICONE 80 MG PO CHEW: 80.0000 mg | CHEWABLE_TABLET | ORAL | Status: DC | PRN

## 2018-09-10 MED ORDER — WITCH HAZEL-GLYCERIN EX PADS: 1.0000 "application " | MEDICATED_PAD | CUTANEOUS | Status: DC | PRN

## 2018-09-10 MED ORDER — ONDANSETRON HCL 4 MG/2ML IJ SOLN: 4.0000 mg | Freq: Four times a day (QID) | INTRAMUSCULAR | Status: DC | PRN

## 2018-09-10 MED ORDER — BUTORPHANOL TARTRATE 2 MG/ML IJ SOLN
1.0000 mg | INTRAMUSCULAR | Status: DC | PRN
  Administered 2018-09-10: 1 mg via INTRAVENOUS
  Filled 2018-09-10 (×2): qty 1

## 2018-09-10 MED ORDER — LIDOCAINE HCL (PF) 1 % IJ SOLN: 30.0000 mL | INTRAMUSCULAR | Status: DC | PRN

## 2018-09-10 MED ORDER — OXYTOCIN 10 UNIT/ML IJ SOLN
INTRAMUSCULAR | Status: AC
  Filled 2018-09-10: qty 2

## 2018-09-10 MED ORDER — ONDANSETRON HCL 4 MG PO TABS: 4.0000 mg | ORAL_TABLET | ORAL | Status: DC | PRN

## 2018-09-10 MED ORDER — COCONUT OIL OIL
1.0000 "application " | TOPICAL_OIL | Status: DC | PRN
  Administered 2018-09-10: 1 via TOPICAL
  Filled 2018-09-10: qty 120

## 2018-09-10 MED ORDER — BENZOCAINE-MENTHOL 20-0.5 % EX AERO
1.0000 "application " | INHALATION_SPRAY | CUTANEOUS | Status: DC | PRN
  Administered 2018-09-10: 1 via TOPICAL
  Filled 2018-09-10: qty 56

## 2018-09-10 MED ORDER — OXYCODONE-ACETAMINOPHEN 5-325 MG PO TABS: 2.0000 | ORAL_TABLET | ORAL | Status: DC | PRN

## 2018-09-10 MED ORDER — OXYTOCIN 40 UNITS IN LACTATED RINGERS INFUSION - SIMPLE MED
2.5000 [IU]/h | INTRAVENOUS | Status: DC
  Administered 2018-09-10: 2.5 [IU]/h via INTRAVENOUS

## 2018-09-10 MED ORDER — METHYLERGONOVINE MALEATE 0.2 MG PO TABS: 0.2000 mg | ORAL_TABLET | ORAL | Status: DC | PRN

## 2018-09-10 MED ORDER — LIDOCAINE HCL (PF) 1 % IJ SOLN
INTRAMUSCULAR | Status: AC
  Filled 2018-09-10: qty 30

## 2018-09-10 MED ORDER — FENTANYL 2.5 MCG/ML W/ROPIVACAINE 0.15% IN NS 100 ML EPIDURAL (ARMC)
12.0000 mL/h | EPIDURAL | Status: DC
  Administered 2018-09-10: 12 mL/h via EPIDURAL

## 2018-09-10 MED ORDER — SOD CITRATE-CITRIC ACID 500-334 MG/5ML PO SOLN: 30.0000 mL | ORAL | Status: DC | PRN

## 2018-09-10 MED ORDER — MISOPROSTOL 200 MCG PO TABS
ORAL_TABLET | ORAL | Status: AC
  Filled 2018-09-10: qty 4

## 2018-09-10 MED ORDER — DIBUCAINE 1 % RE OINT: 1.0000 "application " | TOPICAL_OINTMENT | RECTAL | Status: DC | PRN

## 2018-09-10 MED ORDER — PRENATAL MULTIVITAMIN CH: 1.0000 | ORAL_TABLET | Freq: Every day | ORAL | Status: DC

## 2018-09-10 MED ORDER — OXYTOCIN 40 UNITS IN LACTATED RINGERS INFUSION - SIMPLE MED
1.0000 m[IU]/min | INTRAVENOUS | Status: DC
  Administered 2018-09-10: 2 m[IU]/min via INTRAVENOUS

## 2018-09-10 MED ORDER — BUPIVACAINE HCL (PF) 0.25 % IJ SOLN
INTRAMUSCULAR | Status: DC | PRN
  Administered 2018-09-10: 5 mL via EPIDURAL

## 2018-09-10 MED ORDER — ACETAMINOPHEN 325 MG PO TABS: 650.0000 mg | ORAL_TABLET | ORAL | Status: DC | PRN

## 2018-09-10 MED ORDER — METHYLERGONOVINE MALEATE 0.2 MG/ML IJ SOLN: 0.2000 mg | INTRAMUSCULAR | Status: DC | PRN

## 2018-09-10 MED ORDER — OXYTOCIN BOLUS FROM INFUSION
500.0000 mL | Freq: Once | INTRAVENOUS | Status: AC
  Administered 2018-09-10: 500 mL via INTRAVENOUS

## 2018-09-10 MED ORDER — OXYCODONE-ACETAMINOPHEN 5-325 MG PO TABS
1.0000 | ORAL_TABLET | ORAL | Status: DC | PRN
  Administered 2018-09-10 – 2018-09-11 (×3): 1 via ORAL
  Filled 2018-09-10 (×3): qty 1

## 2018-09-10 MED ORDER — FENTANYL 2.5 MCG/ML W/ROPIVACAINE 0.15% IN NS 100 ML EPIDURAL (ARMC)
EPIDURAL | Status: AC
  Filled 2018-09-10: qty 100

## 2018-09-10 MED ORDER — TERBUTALINE SULFATE 1 MG/ML IJ SOLN: 0.2500 mg | Freq: Once | INTRAMUSCULAR | Status: DC | PRN

## 2018-09-10 MED ORDER — DOCUSATE SODIUM 100 MG PO CAPS
100.0000 mg | ORAL_CAPSULE | Freq: Two times a day (BID) | ORAL | Status: DC
  Administered 2018-09-11: 100 mg via ORAL
  Filled 2018-09-10: qty 1

## 2018-09-10 MED ORDER — OXYTOCIN 40 UNITS IN LACTATED RINGERS INFUSION - SIMPLE MED
INTRAVENOUS | Status: AC
  Administered 2018-09-10: 2 m[IU]/min via INTRAVENOUS
  Filled 2018-09-10: qty 1000

## 2018-09-10 MED ORDER — SENNOSIDES-DOCUSATE SODIUM 8.6-50 MG PO TABS: 2.0000 | ORAL_TABLET | ORAL | Status: DC

## 2018-09-10 MED ORDER — ACETAMINOPHEN 325 MG PO TABS
650.0000 mg | ORAL_TABLET | ORAL | Status: DC | PRN
  Administered 2018-09-10: 650 mg via ORAL
  Filled 2018-09-10 (×2): qty 2

## 2018-09-10 MED ORDER — FERROUS SULFATE 325 (65 FE) MG PO TABS
325.0000 mg | ORAL_TABLET | Freq: Every day | ORAL | Status: DC
  Administered 2018-09-11: 325 mg via ORAL
  Filled 2018-09-10: qty 1

## 2018-09-10 MED ORDER — AMMONIA AROMATIC IN INHA
RESPIRATORY_TRACT | Status: AC
  Filled 2018-09-10: qty 10
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
## 2018-09-10 NOTE — Anesthesia Procedure Notes (Signed)
Epidural Patient location during procedure: OB  Staffing Anesthesiologist: Jiovani Mccammon, MD Performed: anesthesiologist   Preanesthetic Checklist Completed: patient identified, site marked, surgical consent, pre-op evaluation, timeout performed, IV checked, risks and benefits discussed and monitors and equipment checked  Epidural Patient position: sitting Prep: ChloraPrep Patient monitoring: heart rate, continuous pulse ox and blood pressure Approach: midline Location: L4-L5 Injection technique: LOR saline  Needle:  Needle type: Tuohy  Needle gauge: 18 G Needle length: 9 cm and 9 Catheter type: closed end flexible Catheter size: 20 Guage Test dose: negative and 1.5% lidocaine with Epi 1:200 K  Assessment Sensory level: T10 Events: blood not aspirated, injection not painful, no injection resistance, negative IV test and no paresthesia  Additional Notes   Patient tolerated the insertion well without complications.Reason for block:procedure for pain     

## 2018-09-10 NOTE — H&P (Signed)
History and Physical   HPI  Hayley Kirk is a 32 y.o. [id number] at 671w2d Estimated Date of Delivery: 09/15/18 who is being admitted for SROM   OB History  OB History  Gravida Para Term Preterm AB Living  7 4 3 1 2 4   SAB TAB Ectopic Multiple Live Births  1 1 0 0 4    # Outcome Date GA Lbr Len/2nd Weight Sex Delivery Anes PTL Lv  7 Current           6 Term 05/08/16 381w2d / 00:09 3860 g F Vag-Spont EPI  LIV     Name: Hayley Kirk     Apgar1: 8  Apgar5: 9  5 TAB 02/2015 750w0d       FD  4 SAB 2014 7654w0d       FD  3 Term 03/18/12 2337w0d  2722 g M Vag-Spont   LIV  2 Preterm 06/09/09 4962w0d  2268 g M Vag-Spont  Y LIV  1 Term 04/19/08 3861w0d  3175 g F Vag-Spont  N LIV    Obstetric Comments  Pt told after miscarriage that she could not have any more children "punch a hole in cervix?"    PROBLEM LIST  Pregnancy complications or risks: Patient Active Problem List   Diagnosis Date Noted  . Labor and delivery indication for care or intervention 09/10/2018  . Labor and delivery, indication for care 08/30/2018  . History of depression 04/09/2018  . History of anxiety 04/09/2018  . Pleuritic chest pain 05/11/2016  . UTI (lower urinary tract infection) 05/11/2016  . Family history of breast cancer in mother 10/29/2015  . History of preterm delivery, currently pregnant 09/21/2015    Prenatal labs and studies: ABO, Rh: --/--/A POS (12/02 0420) Antibody: NEG (12/02 0420) Rubella: 13.10 (05/21 1625) RPR: Non Reactive (09/19 1528)  HBsAg: Negative (05/21 1625)  HIV: Non Reactive (05/21 1625)  AVW:UJWJXBJYGBS:Negative (11/08 1217)   Past Medical History:  Diagnosis Date  . Migraines    hx of migraines     Past Surgical History:  Procedure Laterality Date  . DILATION AND CURETTAGE OF UTERUS       Medications    Current Discharge Medication List    CONTINUE these medications which have NOT CHANGED   Details  Prenatal Vit-Fe Fumarate-FA (PRENATAL MULTIVITAMIN) TABS tablet Take 1  tablet by mouth daily at 12 noon.    docusate sodium (COLACE) 100 MG capsule Take 1 capsule (100 mg total) by mouth 2 (two) times daily. Qty: 60 capsule, Refills: 2   Associated Diagnoses: Supervision of normal intrauterine pregnancy in multigravida in first trimester         Allergies  Patient has no known allergies.  Review of Systems  Constitutional: negative Eyes: negative Ears, nose, mouth, throat, and face: negative Respiratory: negative Cardiovascular: negative Gastrointestinal: negative Genitourinary:negative Integument/breast: negative Hematologic/lymphatic: negative Musculoskeletal:negative Neurological: negative Behavioral/Psych: negative Endocrine: negative Allergic/Immunologic: negative  Physical Exam  BP 113/76   Pulse 84   Temp 98.1 F (36.7 C) (Oral)   Resp 20   Ht 5\' 4"  (1.626 m)   Wt 77.1 kg   LMP 12/09/2017   BMI 29.18 kg/m   Lungs:  CTA B Cardio: RRR  Abd: Soft, gravid, NT Presentation: cephalic EXT: No C/C/ 1+ Edema DTRs: 2+ B CERVIX: Dilation: 6.5 Effacement (%): 80 Cervical Position: Anterior Station: -1 Presentation: Vertex Exam by:: sca   See Prenatal records for more detailed PE.     FHR:  Baseline: 1203  bpm, Variability: Good {> 6 bpm), Accelerations: Reactive and Decelerations: Absent  Toco: Uterine Contractions: Frequency: Every 4-5 minutes, Duration: 90-120 seconds and Intensity: moderate   Test Results  Results for orders placed or performed during the hospital encounter of 09/10/18 (from the past 24 hour(s))  CBC     Status: Abnormal   Collection Time: 09/10/18  4:20 AM  Result Value Ref Range   WBC 7.2 4.0 - 10.5 K/uL   RBC 3.89 3.87 - 5.11 MIL/uL   Hemoglobin 12.5 12.0 - 15.0 g/dL   HCT 16.1 09.6 - 04.5 %   MCV 97.7 80.0 - 100.0 fL   MCH 32.1 26.0 - 34.0 pg   MCHC 32.9 30.0 - 36.0 g/dL   RDW 40.9 81.1 - 91.4 %   Platelets 133 (L) 150 - 400 K/uL   nRBC 0.3 (H) 0.0 - 0.2 %  Type and screen [facility]     Status: None   Collection Time: 09/10/18  4:20 AM  Result Value Ref Range   ABO/RH(D) A POS    Antibody Screen NEG    Sample Expiration      09/13/2018 Performed at [facility], [address] [place], [place] [address]    Group B Strep negative  Assessment   [id number] at [redacted]w[redacted]d Estimated Date of Delivery: 09/15/18  The fetus is reassuring.   Patient Active Problem List   Diagnosis Date Noted  . Labor and delivery indication for care or intervention 09/10/2018  . Labor and delivery, indication for care 08/30/2018  . History of depression 04/09/2018  . History of anxiety 04/09/2018  . Pleuritic chest pain 05/11/2016  . UTI (lower urinary tract infection) 05/11/2016  . Family history of breast cancer in mother 10/29/2015  . History of preterm delivery, currently pregnant 09/21/2015    Plan  1. Admit to L&D :   IV Pitocin augmentation 2. EFM:-- Category 1 3. Epidural if desired.  Stadol for IV pain until epidural requested. 4. Admission labs  5. Anticipate NSVD  Doreene Burke, CNM  09/10/2018 6:51 AM

## 2018-09-10 NOTE — Lactation Note (Signed)
This note was copied from a baby's chart. Lactation Consultation Note  Patient Name: Hayley Hayley FolksSarah Talford [id number]'YToday's Date: 09/10/2018   Assisted mom with positioning in comfortable position with pillow support for first breast feeding after delivery.  Mom has flattish nipples, but soft tissue that is easily compressible to get deep latch on breast.  Once mom breast fed on left breast, she was asking for a bottle reporting she did not think she had enough milk.  Demonstrated hand expression getting more on right breast than left.  Explained supply and demand and need for frequent stimulation to bring in mature milk and ensure a plentiful milk supply.  Discouraged giving bottles of formula too early explaining the risks to successful continuation of breast feeding.  Mom agreed to put Hayley Kirk to right breast where he latched with minimal assistance and began strong rhythmic sucking with occasional swallows.  Mom reports breast feeding 392 yr old, 30 year old, 30 year old and 30 year old for 6 months. Reviewed normal course of lactation and routine newborn feeding patterns.  Mom also asking about pumping.  Encouraged mom to just keep putting Hayley Kirk to the breast because he was more effective at getting milk out better than any breast pump.     Maternal Data    Feeding Feeding Type: Bottle Fed - Formula Nipple Type: Slow - flow  LATCH Score                   Interventions    Lactation Tools Discussed/Used     Consult Status      Louis MeckelWilliams, Tyresse Jayson Kay 09/10/2018, 3:20 PM

## 2018-09-10 NOTE — Lactation Note (Signed)
This note was copied from a baby's chart. Lactation Consultation Note  Patient Name: Hayley Cristy FolksSarah Garis ZOXWR'UToday's Date: 09/10/2018 Reason for consult: Follow-up assessment [name] is sleepy at the breast.  Mom gave bottle of formula against LC advice.  Demonstrated waking techniques and got him to latch, but only took a few weak sucks since he had just been given 10 ml of formula.  Reviewed supply and demand and possible risks of continuing to give bottles of formula to successful breast feeding.  Mom agreed to hand express and requested pump to stimulate breast.  Symphony set up in room with instructions in pumping, storage, collection, labeling, cleaning of pump pieces and handling of colostrum.  Visitors entered room and mom decided to wait to pump later.  Encouraged mom to continue to put [name] to the breast whenever he demonstrated hunger cues instead of giving bottles of formula.  Reviewed size of [name]'s stomach and assured mom that she could produce enough milk if she continued to put him to the breast on demand.  Lactation name and number on white board and encouraged mom to call with any questions, concerns or assistance.  Maternal Data Formula Feeding for Exclusion: No Has patient been taught Hand Expression?: Yes Does the patient have breastfeeding experience prior to this delivery?: Yes  Feeding Feeding Type: Breast Fed Nipple Type: Slow - flow  LATCH Score Latch: Repeated attempts needed to sustain latch, nipple held in mouth throughout feeding, stimulation needed to elicit sucking reflex.  Audible Swallowing: None  Type of Nipple: Everted at rest and after stimulation  Comfort (Breast/Nipple): Soft / non-tender  Hold (Positioning): Assistance needed to correctly position infant at breast and maintain latch.(Demonstrated waking techniques)  LATCH Score: 6  Interventions Interventions: Assisted with latch;Skin to skin;Breast compression;Breast massage;Adjust position;Support  pillows  Lactation Tools Discussed/Used Tools: Pump Breast pump type: Double-Electric Breast Pump WIC Program: Yes Pump Review: Setup, frequency, and cleaning;Milk Storage;Other (comment)(SWilliams LC) Initiated by:: S.Lynnmarie Lovett,RN,BSN,IBCLC Date initiated:: 09/10/18   Consult Status Consult Status: Follow-up Follow-up type: Call as needed    Louis MeckelWilliams, Thelmer Legler Kay 09/10/2018, 6:59 PM

## 2018-09-10 NOTE — Progress Notes (Signed)
[id number] Dr Maisie Fushomas notified for epidural placement [phone] Dr Maisie Fushomas at Bedside 0620 Local Test Dose Epidural placed Maternal HR 73 0633 Bolus 1 Drip started

## 2018-09-11 LAB — RPR: RPR Ser Ql: NONREACTIVE

## 2018-09-11 LAB — CBC
HCT: 33.2 % — ABNORMAL LOW (ref 36.0–46.0)
Hemoglobin: 10.7 g/dL — ABNORMAL LOW (ref 12.0–15.0)
MCH: 32.4 pg (ref 26.0–34.0)
MCHC: 32.2 g/dL (ref 30.0–36.0)
MCV: 100.6 fL — ABNORMAL HIGH (ref 80.0–100.0)
PLATELETS: 126 10*3/uL — AB (ref 150–400)
RBC: 3.3 MIL/uL — ABNORMAL LOW (ref 3.87–5.11)
RDW: 13.3 % (ref 11.5–15.5)
WBC: 8.5 10*3/uL (ref 4.0–10.5)
nRBC: 0 % (ref 0.0–0.2)

## 2018-09-11 MED ORDER — IBUPROFEN 600 MG PO TABS
600.0000 mg | ORAL_TABLET | Freq: Four times a day (QID) | ORAL | Status: DC
  Administered 2018-09-11: 600 mg via ORAL
  Filled 2018-09-11: qty 1

## 2018-09-11 MED ORDER — FERROUS SULFATE 325 (65 FE) MG PO TABS: 325.0000 mg | ORAL_TABLET | Freq: Every day | ORAL | 3 refills | Status: DC

## 2018-09-11 MED ORDER — IBUPROFEN 600 MG PO TABS: 600.0000 mg | ORAL_TABLET | Freq: Four times a day (QID) | ORAL | 0 refills | Status: DC

## 2018-09-11 NOTE — Progress Notes (Signed)
This nurse provided discharge instruction to the patient only. Provided and reviewed discharge paperwork and prescriptions. Verified understanding by use of teach back method, pt verbalized understanding as well. Pt to make her own follow up appointment in 6 weeks with Doreene BurkeAnnie Thompson, CNM at [facility]. Discharged with infant to go home to be transported by significant other. Taken to visitor entrance via wheelchair by hospital volunteer.

## 2018-09-11 NOTE — Anesthesia Postprocedure Evaluation (Signed)
Anesthesia Post Note  Patient: Hayley MoraleSarah G Beckmann  Procedure(s) Performed: AN AD HOC LABOR EPIDURAL  Patient location during evaluation: [facility] Anesthesia Type: Epidural Level of consciousness: awake, awake and alert and oriented Pain management: pain level controlled Vital Signs Assessment: vitals unstable and post-procedure vital signs reviewed and stable Respiratory status: spontaneous breathing, nonlabored ventilation and respiratory function stable Cardiovascular status: blood pressure returned to baseline Postop Assessment: no headache Anesthetic complications: no Comments: Moves all extremities, has showered, voided.  Back (puncture site) is tender, not red or draining.  Bandaid remains.     Last Vitals:  Vitals:   09/10/18 1945 09/11/18 0004  BP: 101/69 94/66  Pulse: (!) 59 (!) 57  Resp: 20 18  Temp: 36.7 C 36.6 C  SpO2: 99% 99%    Last Pain:  Vitals:   09/11/18 0225  TempSrc:   PainSc: 3                  Vernie MurdersPope,  Dalaney Needle G

## 2018-09-11 NOTE — Discharge Summary (Addendum)
Obstetric Discharge Summary  Patient ID: Hayley MoraleSarah G Ganesh MRN: [id number] DOB/AGE: [birth year] 32 y.o.   Date of Admission: 09/10/2018  Date of Discharge:  09/11/18  Admitting Diagnosis: Premature rupture of membrane at 5761w2d  Secondary Diagnosis: History preterm delivery, History of depression and anxiety  Mode of Delivery: Normal spontaneous vaginal delivery     Discharge Diagnosis: No other diagnosis   Intrapartum Procedures: epidural and pitocin augmentation   Post partum procedures: None  Complications: None   Brief Hospital Course   Hayley Kirk is a [id number] who had a SVD on 09/10/2018;  for further details of this birth, please refer to the delivey note.  Patient had an uncomplicated postpartum course.  By time of discharge on PPD#1, her pain was controlled on oral pain medications; she had appropriate lochia and was ambulating, voiding without difficulty and tolerating regular diet.  She was deemed stable for discharge to home.    Labs  CBC Latest Ref Rng & Units 09/11/2018 09/10/2018 06/28/2018  WBC 4.0 - 10.5 K/uL 8.5 7.2 3.3(L)  Hemoglobin 12.0 - 15.0 g/dL 10.7(L) 12.5 11.6  Hematocrit 36.0 - 46.0 % 33.2(L) 38.0 35.3  Platelets 150 - 400 K/uL 126(L) 133(L) 169   A POS  Physical exam:   Temp:  [97.9 F (36.6 C)-98.5 F (36.9 C)] 98.5 F (36.9 C) (12/03 0846) Pulse Rate:  [57-64] 64 (12/03 0846) Resp:  [18-20] 18 (12/03 0846) BP: (94-112)/(66-75) 112/75 (12/03 0846) SpO2:  [99 %-100 %] 100 % (12/03 0846)  General: alert and no distress  Lochia: appropriate  Abdomen: soft, NT  Uterine Fundus: firm  Perineum: intact, no significant drainage, no dehiscence, no significant erythema  Extremities: no evidence of DVT seen on physical exam. No lower extremity edema.  Discharge Instructions: Per After Visit Summary.  Activity: Advance as tolerated. Pelvic rest for 6 weeks.  Also refer to After Visit Summary  Diet: Regular  Medications: Allergies as of  09/11/2018   No Known Allergies     Medication List    TAKE these medications   docusate sodium 100 MG capsule Commonly known as:  COLACE Take 1 capsule (100 mg total) by mouth 2 (two) times daily.   ferrous sulfate 325 (65 FE) MG tablet Take 1 tablet (325 mg total) by mouth daily with breakfast. Start taking on:  09/12/2018   ibuprofen 600 MG tablet Commonly known as:  ADVIL,MOTRIN Take 1 tablet (600 mg total) by mouth every 6 (six) hours.   prenatal multivitamin Tabs tablet Take 1 tablet by mouth daily at 12 noon.      Outpatient follow up:  Follow-up Information    Doreene Burkehompson, Annie, CNM. Call.   Specialties:  Certified Nurse Midwife, Radiology Why:  Please call to schedule six (6) week postpartum visit with Doreene BurkeAnnie Thompson Contact information: [address] [place] [address] [phone]          Postpartum contraception: Nexplanon  Discharged Condition: stable  Discharged to: home   Newborn Data:  Disposition:home with mother  Apgars: APGAR (1 MIN): 8   APGAR (5 MINS): 9    Baby Feeding: Bottle and Breast   Gunnar BullaJenkins Michelle Lonie Newsham, CNM [facility] 09/11/18 1:46 PM

## 2018-09-11 NOTE — Discharge Instructions (Signed)
Breastfeeding Choosing to breastfeed is one of the best decisions you can make for yourself and your baby. A change in hormones during pregnancy causes your breasts to make breast milk in your milk-producing glands. Hormones prevent breast milk from being released before your baby is born. They also prompt milk flow after birth. Once breastfeeding has begun, thoughts of your baby, as well as his or her sucking or crying, can stimulate the release of milk from your milk-producing glands. Benefits of breastfeeding Research shows that breastfeeding offers many health benefits for infants and mothers. It also offers a cost-free and convenient way to feed your baby. For your baby  Your first milk (colostrum) helps your baby's digestive system to function better.  Special cells in your milk (antibodies) help your baby to fight off infections.  Breastfed babies are less likely to develop asthma, allergies, obesity, or type 2 diabetes. They are also at lower risk for sudden infant death syndrome (SIDS).  Nutrients in breast milk are better able to meet your babys needs compared to infant formula.  Breast milk improves your baby's brain development. For you  Breastfeeding helps to create a very special bond between you and your baby.  Breastfeeding is convenient. Breast milk costs nothing and is always available at the correct temperature.  Breastfeeding helps to burn calories. It helps you to lose the weight that you gained during pregnancy.  Breastfeeding makes your uterus return faster to its size before pregnancy. It also slows bleeding (lochia) after you give birth.  Breastfeeding helps to lower your risk of developing type 2 diabetes, osteoporosis, rheumatoid arthritis, cardiovascular disease, and breast, ovarian, uterine, and endometrial cancer later in life. Breastfeeding basics Starting breastfeeding  Find a comfortable place to sit or lie down, with your neck and back  well-supported.  Place a pillow or a rolled-up blanket under your baby to bring him or her to the level of your breast (if you are seated). Nursing pillows are specially designed to help support your arms and your baby while you breastfeed.  Make sure that your baby's tummy (abdomen) is facing your abdomen.  Gently massage your breast. With your fingertips, massage from the outer edges of your breast inward toward the nipple. This encourages milk flow. If your milk flows slowly, you may need to continue this action during the feeding.  Support your breast with 4 fingers underneath and your thumb above your nipple (make the letter "C" with your hand). Make sure your fingers are well away from your nipple and your babys mouth.  Stroke your baby's lips gently with your finger or nipple.  When your baby's mouth is open wide enough, quickly bring your baby to your breast, placing your entire nipple and as much of the areola as possible into your baby's mouth. The areola is the colored area around your nipple. ? More areola should be visible above your baby's upper lip than below the lower lip. ? Your baby's lips should be opened and extended outward (flanged) to ensure an adequate, comfortable latch. ? Your baby's tongue should be between his or her lower gum and your breast.  Make sure that your baby's mouth is correctly positioned around your nipple (latched). Your baby's lips should create a seal on your breast and be turned out (everted).  It is common for your baby to suck about 2-3 minutes in order to start the flow of breast milk. Latching Teaching your baby how to latch onto your breast properly is very  important. An improper latch can cause nipple pain, decreased milk supply, and poor weight gain in your baby. Also, if your baby is not latched onto your nipple properly, he or she may swallow some air during feeding. This can make your baby fussy. Burping your baby when you switch breasts  during the feeding can help to get rid of the air. However, teaching your baby to latch on properly is still the best way to prevent fussiness from swallowing air while breastfeeding. °Signs that your baby has successfully latched onto your nipple °· Silent tugging or silent sucking, without causing you pain. Infant's lips should be extended outward (flanged). °· Swallowing heard between every 3-4 sucks once your milk has started to flow (after your let-down milk reflex occurs). °· Muscle movement above and in front of his or her ears while sucking. ° °Signs that your baby has not successfully latched onto your nipple °· Sucking sounds or smacking sounds from your baby while breastfeeding. °· Nipple pain. ° °If you think your baby has not latched on correctly, slip your finger into the corner of your baby’s mouth to break the suction and place it between your baby's gums. Attempt to start breastfeeding again. °Signs of successful breastfeeding °Signs from your baby °· Your baby will gradually decrease the number of sucks or will completely stop sucking. °· Your baby will fall asleep. °· Your baby's body will relax. °· Your baby will retain a small amount of milk in his or her mouth. °· Your baby will let go of your breast by himself or herself. ° °Signs from you °· Breasts that have increased in firmness, weight, and size 1-3 hours after feeding. °· Breasts that are softer immediately after breastfeeding. °· Increased milk volume, as well as a change in milk consistency and color by the fifth day of breastfeeding. °· Nipples that are not sore, cracked, or bleeding. ° °Signs that your baby is getting enough milk °· Wetting at least 1-2 diapers during the first 24 hours after birth. °· Wetting at least 5-6 diapers every 24 hours for the first week after birth. The urine should be clear or pale yellow by the age of 5 days. °· Wetting 6-8 diapers every 24 hours as your baby continues to grow and develop. °· At least 3  stools in a 24-hour period by the age of 5 days. The stool should be soft and yellow. °· At least 3 stools in a 24-hour period by the age of 7 days. The stool should be seedy and yellow. °· No loss of weight greater than 10% of birth weight during the first 3 days of life. °· Average weight gain of 4-7 oz (113-198 g) per week after the age of 4 days. °· Consistent daily weight gain by the age of 5 days, without weight loss after the age of 2 weeks. °After a feeding, your baby may spit up a small amount of milk. This is normal. °Breastfeeding frequency and duration °Frequent feeding will help you make more milk and can prevent sore nipples and extremely full breasts (breast engorgement). Breastfeed when you feel the need to reduce the fullness of your breasts or when your baby shows signs of hunger. This is called "breastfeeding on demand." Signs that your baby is hungry include: °· Increased alertness, activity, or restlessness. °· Movement of the head from side to side. °· Opening of the mouth when the corner of the mouth or cheek is stroked (rooting). °· Increased sucking sounds,   smacking lips, cooing, sighing, or squeaking. °· Hand-to-mouth movements and sucking on fingers or hands. °· Fussing or crying. ° °Avoid introducing a pacifier to your baby in the first 4-6 weeks after your baby is born. After this time, you may choose to use a pacifier. Research has shown that pacifier use during the first year of a baby's life decreases the risk of sudden infant death syndrome (SIDS). °Allow your baby to feed on each breast as long as he or she wants. When your baby unlatches or falls asleep while feeding from the first breast, offer the second breast. Because newborns are often sleepy in the first few weeks of life, you may need to awaken your baby to get him or her to feed. °Breastfeeding times will vary from baby to baby. However, the following rules can serve as a guide to help you make sure that your baby is  properly fed: °· Newborns (babies 4 weeks of age or younger) may breastfeed every 1-3 hours. °· Newborns should not go without breastfeeding for longer than 3 hours during the day or 5 hours during the night. °· You should breastfeed your baby a minimum of 8 times in a 24-hour period. ° °Breast milk pumping °Pumping and storing breast milk allows you to make sure that your baby is exclusively fed your breast milk, even at times when you are unable to breastfeed. This is especially important if you go back to work while you are still breastfeeding, or if you are not able to be present during feedings. Your lactation consultant can help you find a method of pumping that works best for you and give you guidelines about how long it is safe to store breast milk. °Caring for your breasts while you breastfeed °Nipples can become dry, cracked, and sore while breastfeeding. The following recommendations can help keep your breasts moisturized and healthy: °· Avoid using soap on your nipples. °· Wear a supportive bra designed especially for nursing. Avoid wearing underwire-style bras or extremely tight bras (sports bras). °· Air-dry your nipples for 3-4 minutes after each feeding. °· Use only cotton bra pads to absorb leaked breast milk. Leaking of breast milk between feedings is normal. °· Use lanolin on your nipples after breastfeeding. Lanolin helps to maintain your skin's normal moisture barrier. Pure lanolin is not harmful (not toxic) to your baby. You may also hand express a few drops of breast milk and gently massage that milk into your nipples and allow the milk to air-dry. ° °In the first few weeks after giving birth, some women experience breast engorgement. Engorgement can make your breasts feel heavy, warm, and tender to the touch. Engorgement peaks within 3-5 days after you give birth. The following recommendations can help to ease engorgement: °· Completely empty your breasts while breastfeeding or pumping. You  may want to start by applying warm, moist heat (in the shower or with warm, water-soaked hand towels) just before feeding or pumping. This increases circulation and helps the milk flow. If your baby does not completely empty your breasts while breastfeeding, pump any extra milk after he or she is finished. °· Apply ice packs to your breasts immediately after breastfeeding or pumping, unless this is too uncomfortable for you. To do this: °? Put ice in a plastic bag. °? Place a towel between your skin and the bag. °? Leave the ice on for 20 minutes, 2-3 times a day. °· Make sure that your baby is latched on and positioned properly while   breastfeeding. ° °If engorgement persists after 48 hours of following these recommendations, contact your health care provider or a lactation consultant. °Overall health care recommendations while breastfeeding °· Eat 3 healthy meals and 3 snacks every day. Well-nourished mothers who are breastfeeding need an additional 450-500 calories a day. You can meet this requirement by increasing the amount of a balanced diet that you eat. °· Drink enough water to keep your urine pale yellow or clear. °· Rest often, relax, and continue to take your prenatal vitamins to prevent fatigue, stress, and low vitamin and mineral levels in your body (nutrient deficiencies). °· Do not use any products that contain nicotine or tobacco, such as cigarettes and e-cigarettes. Your baby may be harmed by chemicals from cigarettes that pass into breast milk and exposure to secondhand smoke. If you need help quitting, ask your health care provider. °· Avoid alcohol. °· Do not use illegal drugs or marijuana. °· Talk with your health care provider before taking any medicines. These include over-the-counter and prescription medicines as well as vitamins and herbal supplements. Some medicines that may be harmful to your baby can pass through breast milk. °· It is possible to become pregnant while breastfeeding. If  birth control is desired, ask your health care provider about options that will be safe while breastfeeding your baby. °Where to find more information: °[employer]: www.llli.org °Contact a health care provider if: °· You feel like you want to stop breastfeeding or have become frustrated with breastfeeding. °· Your nipples are cracked or bleeding. °· Your breasts are red, tender, or warm. °· You have: °? Painful breasts or nipples. °? A swollen area on either breast. °? A fever or chills. °? Nausea or vomiting. °? Drainage other than breast milk from your nipples. °· Your breasts do not become full before feedings by the fifth day after you give birth. °· You feel sad and depressed. °· Your baby is: °? Too sleepy to eat well. °? Having trouble sleeping. °? More than 1 week old and wetting fewer than 6 diapers in a 24-hour period. °? Not gaining weight by 5 days of age. °· Your baby has fewer than 3 stools in a 24-hour period. °· Your baby's skin or the white parts of his or her eyes become yellow. °Get help right away if: °· Your baby is overly tired (lethargic) and does not want to wake up and feed. °· Your baby develops an unexplained fever. °Summary °· Breastfeeding offers many health benefits for infant and mothers. °· Try to breastfeed your infant when he or she shows early signs of hunger. °· Gently tickle or stroke your baby's lips with your finger or nipple to allow the baby to open his or her mouth. Bring the baby to your breast. Make sure that much of the areola is in your baby's mouth. Offer one side and burp the baby before you offer the other side. °· Talk with your health care provider or lactation consultant if you have questions or you face problems as you breastfeed. °This information is not intended to replace advice given to you by your health care provider. Make sure you discuss any questions you have with your health care provider. °Document Released: 09/26/2005 Document  Revised: 10/28/2016 Document Reviewed: 10/28/2016 °[employer] Interactive Patient Education © 2018 [employer] °Vaginal Delivery, Care After °Refer to this sheet in the next few weeks. These instructions provide you with information about caring for yourself after vaginal delivery. Your health care   provider may also give you more specific instructions. Your treatment has been planned according to current medical practices, but problems sometimes occur. Call your health care provider if you have any problems or questions. °What can I expect after the procedure? °After vaginal delivery, it is common to have: °· Some bleeding from your vagina. °· Soreness in your abdomen, your vagina, and the area of skin between your vaginal opening and your anus (perineum). °· Pelvic cramps. °· Fatigue. ° °Follow these instructions at home: °Medicines °· Take over-the-counter and prescription medicines only as told by your health care provider. °· If you were prescribed an antibiotic medicine, take it as told by your health care provider. Do not stop taking the antibiotic until it is finished. °Driving ° °· Do not drive or operate heavy machinery while taking prescription pain medicine. °· Do not drive for 24 hours if you received a sedative. °Lifestyle °· Do not drink alcohol. This is especially important if you are breastfeeding or taking medicine to relieve pain. °· Do not use tobacco products, including cigarettes, chewing tobacco, or e-cigarettes. If you need help quitting, ask your health care provider. °Eating and drinking °· Drink at least 8 eight-ounce glasses of water every day unless you are told not to by your health care provider. If you choose to breastfeed your baby, you may need to drink more water than this. °· Eat high-fiber foods every day. These foods may help prevent or relieve constipation. High-fiber foods include: °? Whole grain cereals and breads. °? Brown rice. °? Beans. °? Fresh fruits and  vegetables. °Activity °· Return to your normal activities as told by your health care provider. Ask your health care provider what activities are safe for you. °· Rest as much as possible. Try to rest or take a nap when your baby is sleeping. °· Do not lift anything that is heavier than your baby or 10 lb (4.5 kg) until your health care provider says that it is safe. °· Talk with your health care provider about when you can engage in sexual activity. This may depend on your: °? Risk of infection. °? Rate of healing. °? Comfort and desire to engage in sexual activity. °Vaginal Care °· If you have an episiotomy or a vaginal tear, check the area every day for signs of infection. Check for: °? More redness, swelling, or pain. °? More fluid or blood. °? Warmth. °? Pus or a bad smell. °· Do not use tampons or douches until your health care provider says this is safe. °· Watch for any blood clots that may pass from your vagina. These may look like clumps of dark red, brown, or black discharge. °General instructions °· Keep your perineum clean and dry as told by your health care provider. °· Wear loose, comfortable clothing. °· Wipe from front to back when you use the toilet. °· Ask your health care provider if you can shower or take a bath. If you had an episiotomy or a perineal tear during labor and delivery, your health care provider may tell you not to take baths for a certain length of time. °· Wear a bra that supports your breasts and fits you well. °· If possible, have someone help you with household activities and help care for your baby for at least a few days after you leave the hospital. °· Keep all follow-up visits for you and your baby as told by your health care provider. This is important. °Contact a health care provider   if:  You have: ? Vaginal discharge that has a bad smell. ? Difficulty urinating. ? Pain when urinating. ? A sudden increase or decrease in the frequency of your bowel movements. ? More  redness, swelling, or pain around your episiotomy or vaginal tear. ? More fluid or blood coming from your episiotomy or vaginal tear. ? Pus or a bad smell coming from your episiotomy or vaginal tear. ? A fever. ? A rash. ? Little or no interest in activities you used to enjoy. ? Questions about caring for yourself or your baby.  Your episiotomy or vaginal tear feels warm to the touch.  Your episiotomy or vaginal tear is separating or does not appear to be healing.  Your breasts are painful, hard, or turn red.  You feel unusually sad or worried.  You feel nauseous or you vomit.  You pass large blood clots from your vagina. If you pass a blood clot from your vagina, save it to show to your health care provider. Do not flush blood clots down the toilet without having your health care provider look at them.  You urinate more than usual.  You are dizzy or light-headed.  You have not breastfed at all and you have not had a menstrual period for 12 weeks after delivery.  You have stopped breastfeeding and you have not had a menstrual period for 12 weeks after you stopped breastfeeding. Get help right away if:  You have: ? Pain that does not go away or does not get better with medicine. ? Chest pain. ? Difficulty breathing. ? Blurred vision or spots in your vision. ? Thoughts about hurting yourself or your baby.  You develop pain in your abdomen or in one of your legs.  You develop a severe headache.  You faint.  You bleed from your vagina so much that you fill two sanitary pads in one hour. This information is not intended to replace advice given to you by your health care provider. Make sure you discuss any questions you have with your health care provider. Document Released: 09/23/2000 Document Revised: 03/09/2016 Document Reviewed: 10/11/2015 [employer] Interactive Patient Education  2018 [employer] Postpartum Care After Vaginal Delivery The period of time right after you  deliver your newborn is called the postpartum period. What kind of medical care will I receive?  You may continue to receive fluids and medicines through an IV tube inserted into one of your veins.  If an incision was made near your vagina (episiotomy) or if you had some vaginal tearing during delivery, cold compresses may be placed on your episiotomy or your tear. This helps to reduce pain and swelling.  You may be given a squirt bottle to use when you go to the bathroom. You may use this until you are comfortable wiping as usual. To use the squirt bottle, follow these steps: ? Before you urinate, fill the squirt bottle with warm water. Do not use hot water. ? After you urinate, while you are sitting on the toilet, use the squirt bottle to rinse the area around your urethra and vaginal opening. This rinses away any urine and blood. ? You may do this instead of wiping. As you start healing, you may use the squirt bottle before wiping yourself. Make sure to wipe gently. ? Fill the squirt bottle with clean water every time you use the bathroom.  You will be given sanitary pads to wear. How can I expect to feel?  You may not feel the need  to urinate for several hours after delivery.  You will have some soreness and pain in your abdomen and vagina.  If you are breastfeeding, you may have uterine contractions every time you breastfeed for up to several weeks postpartum. Uterine contractions help your uterus return to its normal size.  It is normal to have vaginal bleeding (lochia) after delivery. The amount and appearance of lochia is often similar to a menstrual period in the first week after delivery. It will gradually decrease over the next few weeks to a dry, yellow-brown discharge. For most women, lochia stops completely by 6-8 weeks after delivery. Vaginal bleeding can vary from woman to woman.  Within the first few days after delivery, you may have breast engorgement. This is when your  breasts feel heavy, full, and uncomfortable. Your breasts may also throb and feel hard, tightly stretched, warm, and tender. After this occurs, you may have milk leaking from your breasts.Your health care provider can help you relieve discomfort due to breast engorgement. Breast engorgement should go away within a few days.  You may feel more sad or worried than normal due to hormonal changes after delivery. These feelings should not last more than a few days. If these feelings do not go away after several days, speak with your health care provider. How should I care for myself?  Tell your health care provider if you have pain or discomfort.  Drink enough water to keep your urine clear or pale yellow.  Wash your hands thoroughly with soap and water for at least 20 seconds after changing your sanitary pads, after using the toilet, and before holding or feeding your baby.  If you are not breastfeeding, avoid touching your breasts a lot. Doing this can make your breasts produce more milk.  If you become weak or lightheaded, or you feel like you might faint, ask for help before: ? Getting out of bed. ? Showering.  Change your sanitary pads frequently. Watch for any changes in your flow, such as a sudden increase in volume, a change in color, the passing of large blood clots. If you pass a blood clot from your vagina, save it to show to your health care provider. Do not flush blood clots down the toilet without having your health care provider look at them.  Make sure that all your vaccinations are up to date. This can help protect you and your baby from getting certain diseases. You may need to have immunizations done before you leave the hospital.  If desired, talk with your health care provider about methods of family planning or birth control (contraception). How can I start bonding with my baby? Spending as much time as possible with your baby is very important. During this time, you and your  baby can get to know each other and develop a bond. Having your baby stay with you in your room (rooming in) can give you time to get to know your baby. Rooming in can also help you become comfortable caring for your baby. Breastfeeding can also help you bond with your baby. How can I plan for returning home with my baby?  Make sure that you have a car seat installed in your vehicle. ? Your car seat should be checked by a certified car seat installer to make sure that it is installed safely. ? Make sure that your baby fits into the car seat safely.  Ask your health care provider any questions you have about caring for yourself or your  baby. Make sure that you are able to contact your health care provider with any questions after leaving the hospital. This information is not intended to replace advice given to you by your health care provider. Make sure you discuss any questions you have with your health care provider. Document Released: 07/24/2007 Document Revised: 02/29/2016 Document Reviewed: 08/31/2015 Elsevier Interactive Patient Education  2018 [employer]. Postpartum Depression and Baby Blues The postpartum period begins right after the birth of a baby. During this time, there is often a great amount of joy and excitement. It is also a time of many changes in the life of the parents. Regardless of how many times a mother gives birth, each child brings new challenges and dynamics to the family. It is not unusual to have feelings of excitement along with confusing shifts in moods, emotions, and thoughts. All mothers are at risk of developing postpartum depression or the "baby blues." These mood changes can occur right after giving birth, or they may occur many months after giving birth. The baby blues or postpartum depression can be mild or severe. Additionally, postpartum depression can go away rather quickly, or it can be a long-term condition. What are the causes? Raised hormone levels and the  rapid drop in those levels are thought to be a main cause of postpartum depression and the baby blues. A number of hormones change during and after pregnancy. Estrogen and progesterone usually decrease right after the delivery of your baby. The levels of thyroid hormone and various cortisol steroids also rapidly drop. Other factors that play a role in these mood changes include major life events and genetics. What increases the risk? If you have any of the following risks for the baby blues or postpartum depression, know what symptoms to watch out for during the postpartum period. Risk factors that may increase the likelihood of getting the baby blues or postpartum depression include:  Having a personal or family history of depression.  Having depression while being pregnant.  Having premenstrual mood issues or mood issues related to oral contraceptives.  Having a lot of life stress.  Having marital conflict.  Lacking a social support network.  Having a baby with special needs.  Having health problems, such as diabetes.  What are the signs or symptoms? Symptoms of baby blues include:  Brief changes in mood, such as going from extreme happiness to sadness.  Decreased concentration.  Difficulty sleeping.  Crying spells, tearfulness.  Irritability.  Anxiety.  Symptoms of postpartum depression typically begin within the first month after giving birth. These symptoms include:  Difficulty sleeping or excessive sleepiness.  Marked weight loss.  Agitation.  Feelings of worthlessness.  Lack of interest in activity or food.  Postpartum psychosis is a very serious condition and can be dangerous. Fortunately, it is rare. Displaying any of the following symptoms is cause for immediate medical attention. Symptoms of postpartum psychosis include:  Hallucinations and delusions.  Bizarre or disorganized behavior.  Confusion or disorientation.  How is this diagnosed? A diagnosis  is made by an evaluation of your symptoms. There are no medical or lab tests that lead to a diagnosis, but there are various questionnaires that a health care provider may use to identify those with the baby blues, postpartum depression, or psychosis. Often, a screening tool called the [place] Postnatal Depression Scale is used to diagnose depression in the postpartum period. How is this treated? The baby blues usually goes away on its own in 1-2 weeks. Social support is  often all that is needed. You will be encouraged to get adequate sleep and rest. Occasionally, you may be given medicines to help you sleep. Postpartum depression requires treatment because it can last several months or longer if it is not treated. Treatment may include individual or group therapy, medicine, or both to address any social, physiological, and psychological factors that may play a role in the depression. Regular exercise, a healthy diet, rest, and social support may also be strongly recommended. Postpartum psychosis is more serious and needs treatment right away. Hospitalization is often needed. Follow these instructions at home:  Get as much rest as you can. Nap when the baby sleeps.  Exercise regularly. Some women find yoga and walking to be beneficial.  Eat a balanced and nourishing diet.  Do little things that you enjoy. Have a cup of tea, take a bubble bath, read your favorite magazine, or listen to your favorite music.  Avoid alcohol.  Ask for help with household chores, cooking, grocery shopping, or running errands as needed. Do not try to do everything.  Talk to people close to you about how you are feeling. Get support from your partner, family members, friends, or other new moms.  Try to stay positive in how you think. Think about the things you are grateful for.  Do not spend a lot of time alone.  Only take over-the-counter or prescription medicine as directed by your health care provider.  Keep  all your postpartum appointments.  Let your health care provider know if you have any concerns. Contact a health care provider if: You are having a reaction to or problems with your medicine. Get help right away if:  You have suicidal feelings.  You think you may harm the baby or someone else. This information is not intended to replace advice given to you by your health care provider. Make sure you discuss any questions you have with your health care provider. Document Released: 06/30/2004 Document Revised: 03/03/2016 Document Reviewed: 07/08/2013 Elsevier Interactive Patient Education  2017 [employer]

## 2018-09-18 ENCOUNTER — Telehealth: Payer: Self-pay | Admitting: Obstetrics and Gynecology

## 2018-09-18 ENCOUNTER — Other Ambulatory Visit: Payer: Self-pay | Admitting: Obstetrics and Gynecology

## 2018-09-18 MED ORDER — NORGESTIMATE-ETH ESTRADIOL 0.25-35 MG-MCG PO TABS: 1.0000 | ORAL_TABLET | Freq: Every day | ORAL | 11 refills | Status: DC

## 2018-09-18 NOTE — Telephone Encounter (Signed)
Pt is wanting some birth control pills sent in, she isnt breast feeding

## 2018-09-18 NOTE — Telephone Encounter (Signed)
The patient called and stated that she would like to speak with Amy in regards to her needing her birth control prescription filled and sent in to her pharmacy, The patient did not disclose any other information. Please advise.

## 2018-10-25 ENCOUNTER — Emergency Department: Payer: Medicaid Other

## 2018-10-25 ENCOUNTER — Other Ambulatory Visit: Payer: Self-pay

## 2018-10-25 ENCOUNTER — Encounter: Payer: Self-pay | Admitting: Emergency Medicine

## 2018-10-25 ENCOUNTER — Emergency Department
Admission: EM | Admit: 2018-10-25 | Discharge: 2018-10-25 | Discharge status: Against Medical Advice | Payer: Medicaid Other | Attending: Emergency Medicine | Admitting: Emergency Medicine

## 2018-10-25 DIAGNOSIS — K819 Cholecystitis, unspecified: Secondary | ICD-10-CM | POA: Insufficient documentation

## 2018-10-25 DIAGNOSIS — R1010 Upper abdominal pain, unspecified: Secondary | ICD-10-CM | POA: Diagnosis not present

## 2018-10-25 LAB — CBC WITH DIFFERENTIAL/PLATELET
Abs Immature Granulocytes: 0.11 10*3/uL — ABNORMAL HIGH (ref 0.00–0.07)
Basophils Absolute: 0 10*3/uL (ref 0.0–0.1)
Basophils Relative: 0 %
EOS PCT: 1 %
Eosinophils Absolute: 0.1 10*3/uL (ref 0.0–0.5)
HCT: 43 % (ref 36.0–46.0)
Hemoglobin: 14.2 g/dL (ref 12.0–15.0)
Immature Granulocytes: 2 %
Lymphocytes Relative: 16 %
Lymphs Abs: 1.2 10*3/uL (ref 0.7–4.0)
MCH: 31.9 pg (ref 26.0–34.0)
MCHC: 33 g/dL (ref 30.0–36.0)
MCV: 96.6 fL (ref 80.0–100.0)
Monocytes Absolute: 0.5 10*3/uL (ref 0.1–1.0)
Monocytes Relative: 6 %
Neutro Abs: 5.6 10*3/uL (ref 1.7–7.7)
Neutrophils Relative %: 75 %
PLATELETS: 196 10*3/uL (ref 150–400)
RBC: 4.45 MIL/uL (ref 3.87–5.11)
RDW: 12.3 % (ref 11.5–15.5)
WBC: 7.5 10*3/uL (ref 4.0–10.5)
nRBC: 0 % (ref 0.0–0.2)

## 2018-10-25 LAB — COMPREHENSIVE METABOLIC PANEL
ALT: 18 U/L (ref 0–44)
AST: 22 U/L (ref 15–41)
Albumin: 4.3 g/dL (ref 3.5–5.0)
Alkaline Phosphatase: 57 U/L (ref 38–126)
Anion gap: 10 (ref 5–15)
BUN: 15 mg/dL (ref 6–20)
CO2: 22 mmol/L (ref 22–32)
Calcium: 9.2 mg/dL (ref 8.9–10.3)
Chloride: 105 mmol/L (ref 98–111)
Creatinine, Ser: 0.91 mg/dL (ref 0.44–1.00)
GFR calc Af Amer: >60 mL/min (ref >60)
GFR calc non Af Amer: >60 mL/min (ref >60)
Glucose, Bld: 100 mg/dL — ABNORMAL HIGH (ref 70–99)
Potassium: 3.9 mmol/L (ref 3.5–5.1)
Sodium: 137 mmol/L (ref 135–145)
Total Bilirubin: 1.1 mg/dL (ref 0.3–1.2)
Total Protein: 7.9 g/dL (ref 6.5–8.1)

## 2018-10-25 LAB — URINALYSIS, COMPLETE (UACMP) WITH MICROSCOPIC
Bacteria, UA: NONE SEEN
Bilirubin Urine: NEGATIVE
GLUCOSE, UA: NEGATIVE mg/dL
Ketones, ur: 80 mg/dL — AB
Leukocytes, UA: NEGATIVE
Nitrite: NEGATIVE
Protein, ur: NEGATIVE mg/dL
RBC / HPF: >50 RBC/hpf — ABNORMAL HIGH (ref 0–5)
Specific Gravity, Urine: 1.027 (ref 1.005–1.030)
pH: 5 (ref 5.0–8.0)

## 2018-10-25 LAB — SAMPLE TO BLOOD BANK

## 2018-10-25 LAB — INFLUENZA PANEL BY PCR (TYPE A & B)
Influenza A By PCR: NEGATIVE
Influenza B By PCR: NEGATIVE

## 2018-10-25 LAB — POCT PREGNANCY, URINE: Preg Test, Ur: NEGATIVE

## 2018-10-25 LAB — LIPASE, BLOOD: Lipase: 27 U/L (ref 11–51)

## 2018-10-25 MED ORDER — MORPHINE SULFATE (PF) 4 MG/ML IV SOLN
4.0000 mg | Freq: Once | INTRAVENOUS | Status: AC
  Administered 2018-10-25: 4 mg via INTRAVENOUS
  Filled 2018-10-25: qty 1

## 2018-10-25 MED ORDER — IOHEXOL 300 MG/ML  SOLN
100.0000 mL | Freq: Once | INTRAMUSCULAR | Status: AC | PRN
  Administered 2018-10-25: 100 mL via INTRAVENOUS

## 2018-10-25 MED ORDER — IOPAMIDOL (ISOVUE-300) INJECTION 61%
30.0000 mL | Freq: Once | INTRAVENOUS | Status: AC | PRN
  Administered 2018-10-25: 30 mL via ORAL

## 2018-10-25 MED ORDER — ONDANSETRON HCL 4 MG/2ML IJ SOLN
4.0000 mg | Freq: Once | INTRAMUSCULAR | Status: AC
  Administered 2018-10-25: 4 mg via INTRAVENOUS
  Filled 2018-10-25: qty 2

## 2018-10-25 MED ORDER — KETOROLAC TROMETHAMINE 30 MG/ML IJ SOLN
30.0000 mg | Freq: Once | INTRAMUSCULAR | Status: AC
  Administered 2018-10-25: 30 mg via INTRAVENOUS
  Filled 2018-10-25: qty 1

## 2018-10-25 NOTE — ED Notes (Signed)
Pt requesting to be transferred to [facility], MD and this RN explained process to patient and her options.  Several minutes later patient requesting to be discharged.  MD notified, process of AMA explained to patient.  Patient understands and has no further questions.

## 2018-10-25 NOTE — ED Notes (Signed)
ED Provider at bedside. 

## 2018-10-25 NOTE — ED Notes (Signed)
surgeon at bedside

## 2018-10-25 NOTE — ED Notes (Signed)
Pt stated to this nurse that if she has to have the surgery done, she would like to have it done at [facility] because she had procedures done there in the past and felt most comfortable there.

## 2018-10-25 NOTE — ED Triage Notes (Signed)
C/O right lower back pain x 1 day and feeling like there are "knots" in her stomach. Also vomiting all night.

## 2018-10-25 NOTE — Consult Note (Signed)
Date of Consultation:  10/25/2018  Requesting Physician:  Jene Everyobert Kinner, MD  Reason for Consultation:  Abdominal pain  History of Present Illness: Hayley MoraleSarah G Rallo is a 33 y.o. female presenting with a one day history of right upper quadrant abdominal pain.  She reports feeling well before and then started having RUQ pain last night.  This pain radiated towards her back.  She also endorses nausea and vomiting multiple times last night and today.  Reports having a fever but they cannot say what her temperature was, and she was afebrile in the ED.  Her workup included ultrasound, CT scan, and labs.  I have independently viewed her imaging studies and reviewed her laboratory studies.  Her ultrasound showed cholelithiasis without any evidence of cholecystitis, but she was tender in the RUQ during the study.  Her CT scan shows a somewhat distended gallbladder, with suspicion for a stone in the neck of the gallbladder, but in her ultrasound, the stones were mobile.  Her labs are normal with normal WBC and LFTs.  This is her first episode of pain, but she's had cholelithiasis since at least 11/2016 when she had another CT scan for abdominal pain.  Past Medical History: Past Medical History:  Diagnosis Date  . Migraines    hx of migraines     Past Surgical History: Past Surgical History:  Procedure Laterality Date  . DILATION AND CURETTAGE OF UTERUS      Home Medications: Prior to Admission medications   Medication Sig Start Date End Date Taking? Authorizing Provider  docusate sodium (COLACE) 100 MG capsule Take 1 capsule (100 mg total) by mouth 2 (two) times daily. Patient not taking: Reported on 08/28/2018 03/07/18 03/07/19  Purcell NailsShambley, Melody N, CNM  ferrous sulfate 325 (65 FE) MG tablet Take 1 tablet (325 mg total) by mouth daily with breakfast. Patient not taking: Reported on 10/25/2018 09/12/18   Gunnar BullaLawhorn, Jenkins Michelle, CNM  ibuprofen (ADVIL,MOTRIN) 600 MG tablet Take 1 tablet (600 mg total) by  mouth every 6 (six) hours. Patient not taking: Reported on 10/25/2018 09/11/18   Gunnar BullaLawhorn, Jenkins Michelle, CNM  norgestimate-ethinyl estradiol (ORTHO-CYCLEN,SPRINTEC,PREVIFEM) 0.25-35 MG-MCG tablet Take 1 tablet by mouth daily. Patient not taking: Reported on 10/25/2018 09/18/18   Purcell NailsShambley, Melody N, CNM  Prenatal Vit-Fe Fumarate-FA (PRENATAL MULTIVITAMIN) TABS tablet Take 1 tablet by mouth daily at 12 noon.    [provider]    Allergies: No Known Allergies  Social History:  reports that she has never smoked. She has never used smokeless tobacco. She reports that she does not drink alcohol or use drugs.   Family History: Family History  Problem Relation Age of Onset  . Breast cancer Mother        stage 4  . Stomach cancer Mother   . Diabetes Mother   . Hyperlipidemia Mother   . Hypertension Mother   . Stroke Mother   . Heart failure Father   . Breast cancer Maternal Aunt        x2  . Stomach cancer Maternal Aunt        passed away 881yrs ago  . Migraines Sister     Review of Systems: Review of Systems  Constitutional: Positive for fever. Negative for chills.  HENT: Negative for hearing loss.   Respiratory: Negative for shortness of breath.   Cardiovascular: Negative for chest pain.  Gastrointestinal: Positive for abdominal pain, nausea and vomiting. Negative for constipation and diarrhea.  Genitourinary: Negative for dysuria.  Musculoskeletal: Negative for myalgias.  Skin: Negative for rash.  Neurological: Negative for dizziness.  Psychiatric/Behavioral: Negative for depression.    Physical Exam BP (!) 118/100   Pulse (!) 42   Temp 98.2 F (36.8 C) (Oral)   Resp 18   Ht 5\' 4"  (1.626 m)   Wt 65.8 kg   LMP 10/17/2018   SpO2 99%   BMI 24.89 kg/m  CONSTITUTIONAL: No acute distress HEENT:  Normocephalic, atraumatic, extraocular motion intact. NECK: Trachea is midline, and there is no jugular venous distension. RESPIRATORY:  Lungs are clear, and breath  sounds are equal bilaterally. Normal respiratory effort without pathologic use of accessory muscles. CARDIOVASCULAR: Heart is regular without murmurs, gallops, or rubs. GI: The abdomen is soft, non-distended, with tenderness to palpation in the right upper quadrant, but negative Murphy's sign. MUSCULOSKELETAL:  Normal muscle strength and tone in all four extremities.  No peripheral edema or cyanosis. SKIN: Skin turgor is normal. There are no pathologic skin lesions.  NEUROLOGIC:  Motor and sensation is grossly normal.  Cranial nerves are grossly intact. PSYCH:  Alert and oriented to person, place and time. Affect is normal.  Laboratory Analysis: Results for orders placed or performed during the hospital encounter of 10/25/18 (from the past 24 hour(s))  CBC with Differential     Status: Abnormal   Collection Time: 10/25/18  7:25 AM  Result Value Ref Range   WBC 7.5 4.0 - 10.5 K/uL   RBC 4.45 3.87 - 5.11 MIL/uL   Hemoglobin 14.2 12.0 - 15.0 g/dL   HCT 32.9 51.8 - 84.1 %   MCV 96.6 80.0 - 100.0 fL   MCH 31.9 26.0 - 34.0 pg   MCHC 33.0 30.0 - 36.0 g/dL   RDW 66.0 63.0 - 16.0 %   Platelets 196 150 - 400 K/uL   nRBC 0.0 0.0 - 0.2 %   Neutrophils Relative % 75 %   Neutro Abs 5.6 1.7 - 7.7 K/uL   Lymphocytes Relative 16 %   Lymphs Abs 1.2 0.7 - 4.0 K/uL   Monocytes Relative 6 %   Monocytes Absolute 0.5 0.1 - 1.0 K/uL   Eosinophils Relative 1 %   Eosinophils Absolute 0.1 0.0 - 0.5 K/uL   Basophils Relative 0 %   Basophils Absolute 0.0 0.0 - 0.1 K/uL   Immature Granulocytes 2 %   Abs Immature Granulocytes 0.11 (H) 0.00 - 0.07 K/uL  Comprehensive metabolic panel     Status: Abnormal   Collection Time: 10/25/18  7:25 AM  Result Value Ref Range   Sodium 137 135 - 145 mmol/L   Potassium 3.9 3.5 - 5.1 mmol/L   Chloride 105 98 - 111 mmol/L   CO2 22 22 - 32 mmol/L   Glucose, Bld 100 (H) 70 - 99 mg/dL   BUN 15 6 - 20 mg/dL   Creatinine, Ser 1.09 0.44 - 1.00 mg/dL   Calcium 9.2 8.9 - 32.3  mg/dL   Total Protein 7.9 6.5 - 8.1 g/dL   Albumin 4.3 3.5 - 5.0 g/dL   AST 22 15 - 41 U/L   ALT 18 0 - 44 U/L   Alkaline Phosphatase 57 38 - 126 U/L   Total Bilirubin 1.1 0.3 - 1.2 mg/dL   GFR calc non Af Amer >60 >60 mL/min   GFR calc Af Amer >60 >60 mL/min   Anion gap 10 5 - 15  Lipase, blood     Status: None   Collection Time: 10/25/18  7:25 AM  Result Value Ref Range  Lipase 27 11 - 51 U/L  Urinalysis, Complete w Microscopic     Status: Abnormal   Collection Time: 10/25/18  7:25 AM  Result Value Ref Range   Color, Urine YELLOW (A) YELLOW   APPearance CLEAR (A) CLEAR   Specific Gravity, Urine 1.027 1.005 - 1.030   pH 5.0 5.0 - 8.0   Glucose, UA NEGATIVE NEGATIVE mg/dL   Hgb urine dipstick LARGE (A) NEGATIVE   Bilirubin Urine NEGATIVE NEGATIVE   Ketones, ur 80 (A) NEGATIVE mg/dL   Protein, ur NEGATIVE NEGATIVE mg/dL   Nitrite NEGATIVE NEGATIVE   Leukocytes, UA NEGATIVE NEGATIVE   RBC / HPF >50 (H) 0 - 5 RBC/hpf   WBC, UA 0-5 0 - 5 WBC/hpf   Bacteria, UA NONE SEEN NONE SEEN   Squamous Epithelial / LPF 0-5 0 - 5   Mucus PRESENT   Sample to Blood Bank     Status: None   Collection Time: 10/25/18  7:25 AM  Result Value Ref Range   Blood Bank Specimen SAMPLE AVAILABLE FOR TESTING    Sample Expiration      10/28/2018 Performed at [facility], [address] [place], [place] [address]   Influenza panel by PCR (type A & B)     Status: None   Collection Time: 10/25/18  7:38 AM  Result Value Ref Range   Influenza A By PCR NEGATIVE NEGATIVE   Influenza B By PCR NEGATIVE NEGATIVE  Pregnancy, urine POC     Status: None   Collection Time: 10/25/18  7:48 AM  Result Value Ref Range   Preg Test, Ur NEGATIVE NEGATIVE    Imaging: Ct Abdomen Pelvis W Contrast  Result Date: 10/25/2018 CLINICAL DATA:  Right low back pain.  Vomiting. EXAM: CT ABDOMEN AND PELVIS WITH CONTRAST TECHNIQUE: Multidetector CT imaging of the abdomen and pelvis was performed using the standard  protocol following bolus administration of intravenous contrast. CONTRAST:  OMNIPAQUE IOHEXOL 300 MG/ML  SOLN COMPARISON:  None. FINDINGS: Lower chest: Lung bases are clear. No effusions. Heart is normal size. Hepatobiliary: Multiple gallstones noted within the gallbladder. Gallbladder is moderately distended. Possible gallstone in the gallbladder neck. No focal hepatic abnormality or biliary ductal dilatation. Pancreas: No focal abnormality or ductal dilatation. Spleen: No focal abnormality.  Normal size. Adrenals/Urinary Tract: No adrenal abnormality. No focal renal abnormality. No stones or hydronephrosis. Urinary bladder is unremarkable. Stomach/Bowel: Normal appendix. Stomach, large and small bowel grossly unremarkable. Vascular/Lymphatic: No evidence of aneurysm or adenopathy. Reproductive: Uterus and adnexa unremarkable.  No mass. Other: No free fluid or free air. Musculoskeletal: No acute bony abnormality. IMPRESSION: Multiple gallstones within the gallbladder. Gallbladder is moderately distended and there may be a gallstone lodged in the region of the gallbladder neck. No biliary ductal dilatation. Otherwise no acute findings in the abdomen or pelvis. Electronically Signed   By: Charlett Nose M.D.   On: 10/25/2018 10:35   US Abdomen Limited Ruq  Result Date: 10/25/2018 CLINICAL DATA:  Upper abdominal pain EXAM: ULTRASOUND ABDOMEN LIMITED RIGHT UPPER QUADRANT COMPARISON:  CT 11/26/2016 FINDINGS: Gallbladder: Numerous mobile gallstones within the gallbladder measuring 14 mm or less. No wall thickening or pericholecystic fluid. The patient was tender over the gallbladder during the study. Common bile duct: Diameter: Normal caliber, 3 mm Liver: No focal lesion identified. Within normal limits in parenchymal echogenicity. Portal vein is patent on color Doppler imaging with normal direction of blood flow towards the liver. IMPRESSION: Cholelithiasis. No wall thickening or pericholecystic fluid,  but the  patient was tender over the gallbladder during the study. Can exclude early acute cholecystitis. Electronically Signed   By: Charlett Nose M.D.   On: 10/25/2018 08:23    Assessment and Plan: This is a 33 y.o. female with abdominal pain and either biliary colic vs cholecystitis.  The patient reports that she's feeling a bit better when I saw her compared to earlier, but has required three doses of IV morphine in the ED.  Discussed with her that usually a biliary colic episode lasts a few hours rather than 12+ hours.  Discussed with her that cholecystitis is more likely, and that the treatment plan would involve laparoscopic cholecystectomy.  She is hesitant about surgery and we agreed that we can wait a couple of hours to see how her pain does.  She will get a dose of Toradol to see if that helps better instead of Morphine.  She does understand that if her pain persists, then we would admit her and add her to the schedule for cholecystectomy.    -----  Upon going back to the ED to check on patient, was advised by her RN that the patient left AMA and wanted to go to [facility] instead.  We will be available if the patient has any further surgical needs.    Howie Ill, MD [facility] Pg:  [phone]

## 2018-10-25 NOTE — ED Provider Notes (Addendum)
[facility] Emergency Department Provider Note   ____________________________________________    I have reviewed the triage vital signs and the nursing notes.   HISTORY  Chief Complaint Abdominal Pain and Emesis     HPI Hayley Kirk is a 33 y.o. female who presents with complaints of nausea vomiting abdominal pain and back pain.  Patient describes right-sided mid back pain started yesterday, she tried taking a hot shower with little improvement.  She reports she developed abdominal pain primarily in the right upper quadrant overnight with multiple episodes of nausea and vomiting.  No significant diarrhea.  She thinks he may have had a fever.  Has not taken anything for this.  No history of abdominal surgery.  She is approximately 6 weeks postpartum, uncomplicated pregnancy.  Denies dysuria, some frequency  Past Medical History:  Diagnosis Date  . Migraines    hx of migraines    Patient Active Problem List   Diagnosis Date Noted  . Labor and delivery indication for care or intervention 09/10/2018  . Labor and delivery, indication for care 08/30/2018  . History of depression 04/09/2018  . History of anxiety 04/09/2018  . Pleuritic chest pain 05/11/2016  . UTI (lower urinary tract infection) 05/11/2016  . Family history of breast cancer in mother 10/29/2015  . History of preterm delivery, currently pregnant 09/21/2015    Past Surgical History:  Procedure Laterality Date  . DILATION AND CURETTAGE OF UTERUS      Prior to Admission medications   Medication Sig Start Date End Date Taking? Authorizing Provider  docusate sodium (COLACE) 100 MG capsule Take 1 capsule (100 mg total) by mouth 2 (two) times daily. Patient not taking: Reported on 08/28/2018 03/07/18 03/07/19  Purcell Nails, CNM  ferrous sulfate 325 (65 FE) MG tablet Take 1 tablet (325 mg total) by mouth daily with breakfast. Patient not taking: Reported on 10/25/2018 09/12/18    Gunnar Bulla, CNM  ibuprofen (ADVIL,MOTRIN) 600 MG tablet Take 1 tablet (600 mg total) by mouth every 6 (six) hours. Patient not taking: Reported on 10/25/2018 09/11/18   Gunnar Bulla, CNM  norgestimate-ethinyl estradiol (ORTHO-CYCLEN,SPRINTEC,PREVIFEM) 0.25-35 MG-MCG tablet Take 1 tablet by mouth daily. Patient not taking: Reported on 10/25/2018 09/18/18   Purcell Nails, CNM  Prenatal Vit-Fe Fumarate-FA (PRENATAL MULTIVITAMIN) TABS tablet Take 1 tablet by mouth daily at 12 noon.    [provider]     Allergies Patient has no known allergies.  Family History  Problem Relation Age of Onset  . Breast cancer Mother        stage 4  . Stomach cancer Mother   . Diabetes Mother   . Hyperlipidemia Mother   . Hypertension Mother   . Stroke Mother   . Heart failure Father   . Breast cancer Maternal Aunt        x2  . Stomach cancer Maternal Aunt        passed away 43yrs ago  . Migraines Sister     Social History Social History   Tobacco Use  . Smoking status: Never Smoker  . Smokeless tobacco: Never Used  Substance Use Topics  . Alcohol use: No    Alcohol/week: 0.0 standard drinks  . Drug use: No    Review of Systems  Constitutional: Possible fever Eyes: No visual changes.  ENT: No sore throat. Cardiovascular: Denies chest pain. Respiratory: Denies shortness of breath. Gastrointestinal: As above Genitourinary: Negative for dysuria.  Frequency Musculoskeletal: As  above Skin: Negative for rash. Neurological: Negative for headaches    ____________________________________________   PHYSICAL EXAM:  VITAL SIGNS: ED Triage Vitals  Enc Vitals Group     BP 10/25/18 0711 135/89     Pulse Rate 10/25/18 0711 (!) 48     Resp 10/25/18 0711 16     Temp 10/25/18 0711 98.2 F (36.8 C)     Temp Source 10/25/18 0711 Oral     SpO2 10/25/18 0711 99 %     Weight 10/25/18 0710 65.8 kg (145 lb)     Height 10/25/18 0710 1.626 m (5\' 4" )     Head  Circumference --      Peak Flow --      Pain Score 10/25/18 0709 10     Pain Loc --      Pain Edu? --      Excl. in [place]? --     Constitutional: Alert and oriented. No acute distress.  Eyes: Conjunctivae are normal.   Nose: No congestion/rhinnorhea. Mouth/Throat: Mucous membranes are moist.   Neck:  Painless ROM Cardiovascular: Bradycardia, regular rhythm. Grossly normal heart sounds.  Good peripheral circulation. Respiratory: Normal respiratory effort.  No retractions. Lungs CTAB. Gastrointestinal: Significant tenderness in the right upper quadrant, no right lower quadrant tenderness.  No distention.  No significant CVA tenderness  Musculoskeletal  Warm and well perfused Neurologic:  Normal speech and language. No gross focal neurologic deficits are appreciated.  Skin:  Skin is warm, dry and intact. No rash noted. Psychiatric: Mood and affect are normal. Speech and behavior are normal.  ____________________________________________   LABS (all labs ordered are listed, but only abnormal results are displayed)  Labs Reviewed  CBC WITH DIFFERENTIAL/PLATELET - Abnormal; Notable for the following components:      Result Value   Abs Immature Granulocytes 0.11 (*)    All other components within normal limits  COMPREHENSIVE METABOLIC PANEL - Abnormal; Notable for the following components:   Glucose, Bld 100 (*)    All other components within normal limits  URINALYSIS, COMPLETE (UACMP) WITH MICROSCOPIC - Abnormal; Notable for the following components:   Color, Urine YELLOW (*)    APPearance CLEAR (*)    Hgb urine dipstick LARGE (*)    Ketones, ur 80 (*)    RBC / HPF >50 (*)    All other components within normal limits  LIPASE, BLOOD  INFLUENZA PANEL BY PCR (TYPE A & B)  POCT PREGNANCY, URINE  SAMPLE TO BLOOD BANK   ____________________________________________  EKG  ED ECG REPORT I, Jene Everyobert Lujean Ebright, the attending physician, personally viewed and interpreted this ECG.  Date:  10/25/2018  Rhythm: Sinus bradycardia QRS Axis: normal Intervals: normal ST/T Wave abnormalities: normal Narrative Interpretation: Significant bradycardia noted  ____________________________________________  RADIOLOGY  Ultrasound with positive sonographic Murphy's CT demonstrates multiple gallstones ____________________________________________   PROCEDURES  Procedure(s) performed: No  Procedures   Critical Care performed: No ____________________________________________   INITIAL IMPRESSION / ASSESSMENT AND PLAN / ED COURSE  Pertinent labs & imaging results that were available during my care of the patient were reviewed by me and considered in my medical decision making (see chart for details).  Patient presents with back pain on the right side only followed by nausea vomiting and then abdominal pain.  Differential includes cholecystitis, pyelonephritis, possibly influenza or gastroenteritis.  Bradycardia noted, she does not appear to have a significant history of bradycardia.  We will monitor this closely.  She will require pain medications IV morphine and  IV Zofran because she is quite uncomfortable.  Pending labs urinalysis ultrasound  Ultrasound shows multiple gallstones and positive sonographic Murphy's however no pericholecystic fluid or distention  CT scan shows stone in the neck of the gallbladder as well as distention.  I have consulted Dr. Aleen Campi of general surgery for presumed early cholecystitis  Patient has required multiple doses of IV morphine   ----------------------------------------- 12:24 PM on 10/25/2018 -----------------------------------------  Patient seen by Dr. Aleen Campi of surgery who has recommended surgery to her however she did not want to do surgery and asked to have additional pain medication to "see if the pain goes away ".  Dr. Aleen Campi said that he would check on her again at 1 PM after IV Toradol.  However the patient is stating that she  wants to leave she understands that she has a potential surgical emergency with her gallbladder and it should be removed.  She understands that she could have significant worsening including death/infection/pain by leaving and she accepts these risks.  She does have decisional capacity.    ____________________________________________   FINAL CLINICAL IMPRESSION(S) / ED DIAGNOSES  Final diagnoses:  Upper abdominal pain  Cholecystitis        Note:  This document was prepared using Dragon voice recognition software and may include unintentional dictation errors.   Jene Every, MD 10/25/18 1056    Jene Every, MD 10/25/18 1225

## 2019-04-15 ENCOUNTER — Ambulatory Visit: Payer: Self-pay

## 2019-04-25 ENCOUNTER — Ambulatory Visit: Payer: Self-pay

## 2019-05-08 ENCOUNTER — Ambulatory Visit (LOCAL_COMMUNITY_HEALTH_CENTER): Payer: Self-pay

## 2019-05-08 ENCOUNTER — Other Ambulatory Visit: Payer: Self-pay

## 2019-05-08 VITALS — BP 115/77 | Ht 64.0 in | Wt 151.2 lb

## 2019-05-08 DIAGNOSIS — Z30013 Encounter for initial prescription of injectable contraceptive: Secondary | ICD-10-CM

## 2019-05-08 DIAGNOSIS — Z3009 Encounter for other general counseling and advice on contraception: Secondary | ICD-10-CM

## 2019-05-08 MED ORDER — MEDROXYPROGESTERONE ACETATE 150 MG/ML IM SUSP
150.0000 mg | Freq: Once | INTRAMUSCULAR | Status: AC
  Administered 2019-05-08: 150 mg via INTRAMUSCULAR

## 2019-05-08 NOTE — Progress Notes (Signed)
Depo given in right glut. Per Lora Havens PA order.  Tolerated well Aileen Fass, RN

## 2019-07-15 ENCOUNTER — Emergency Department
Admission: EM | Admit: 2019-07-15 | Discharge: 2019-07-15 | Disposition: A | Discharge status: Home / Self Care | Payer: Self-pay | Attending: Emergency Medicine | Admitting: Emergency Medicine

## 2019-07-15 ENCOUNTER — Other Ambulatory Visit: Payer: Self-pay

## 2019-07-15 ENCOUNTER — Emergency Department: Payer: Self-pay

## 2019-07-15 DIAGNOSIS — B349 Viral infection, unspecified: Secondary | ICD-10-CM | POA: Insufficient documentation

## 2019-07-15 DIAGNOSIS — Z20828 Contact with and (suspected) exposure to other viral communicable diseases: Secondary | ICD-10-CM | POA: Insufficient documentation

## 2019-07-15 HISTORY — DX: Calculus of kidney: N20.0

## 2019-07-15 LAB — SARS CORONAVIRUS 2 (TAT 6-24 HRS): SARS Coronavirus 2: NEGATIVE

## 2019-07-15 NOTE — ED Triage Notes (Signed)
Patient c/o sore throat, cough, body aches X 3 days. Patient's children were exposed to [place] at daycare. Both children and both adults in the household are exhibiting symptoms.

## 2019-07-15 NOTE — ED Notes (Signed)
See triage note  States she developed cold sx's about 2 days ago  Then body aches   Denies any fever or cough

## 2019-07-15 NOTE — Discharge Instructions (Addendum)
Read the information about COVID and quarantine you and your entire family until you have heard from the results of your COVID test.  Tylenol if needed for fever.  Increase fluids.  Return to the emergency department if any severe worsening of your breathing or difficulty breathing.

## 2019-07-15 NOTE — ED Provider Notes (Signed)
[facility] Emergency Department Provider Note  ____________________________________________   None    (approximate)  I have reviewed the triage vital signs and the nursing notes.   HISTORY  Chief Complaint Sore Throat and Generalized Body Aches   HPI Hayley Kirk is a 33 y.o. female presents to the ED with complaint of cold symptoms, congestion and body aches for the last 2 days.  Patient reports subjective fever.  Patient states that children were exposed to a positive COVID while at daycare.  She states that daycare was closed.       Past Medical History:  Diagnosis Date  . Kidney stone   . Migraines    hx of migraines    Patient Active Problem List   Diagnosis Date Noted  . Cholecystitis   . Upper abdominal pain   . Labor and delivery indication for care or intervention 09/10/2018  . Labor and delivery, indication for care 08/30/2018  . History of depression 04/09/2018  . History of anxiety 04/09/2018  . Pleuritic chest pain 05/11/2016  . UTI (lower urinary tract infection) 05/11/2016  . Family history of breast cancer in mother 10/29/2015  . History of preterm delivery, currently pregnant 09/21/2015    Past Surgical History:  Procedure Laterality Date  . DILATION AND CURETTAGE OF UTERUS    . LITHOTRIPSY      Prior to Admission medications   Not on File    Allergies Patient has no known allergies.  Family History  Problem Relation Age of Onset  . Breast cancer Mother        stage 4  . Stomach cancer Mother   . Diabetes Mother   . Hyperlipidemia Mother   . Hypertension Mother   . Stroke Mother   . Heart failure Father   . Breast cancer Maternal Aunt        x2  . Stomach cancer Maternal Aunt        passed away 63yrs ago  . Migraines Sister     Social History Social History   Tobacco Use  . Smoking status: Never Smoker  . Smokeless tobacco: Never Used  Substance Use Topics  . Alcohol use: No    Alcohol/week:  0.0 standard drinks  . Drug use: No    Review of Systems Constitutional: No fever/chills Eyes: No visual changes. ENT: No sore throat.  Positive for congestion. Cardiovascular: Denies chest pain. Respiratory: Denies shortness of breath. Gastrointestinal: No abdominal pain.  No nausea, no vomiting.  No diarrhea. Musculoskeletal: Positive for muscle aches. Skin: Negative for rash. Neurological: Negative for headaches, focal weakness or numbness. ____________________________________________   PHYSICAL EXAM:  VITAL SIGNS: ED Triage Vitals  Enc Vitals Group     BP 07/15/19 0621 (!) 134/91     Pulse Rate 07/15/19 0621 77     Resp 07/15/19 0621 17     Temp 07/15/19 0621 98.5 F (36.9 C)     Temp Source 07/15/19 0621 Oral     SpO2 07/15/19 0621 94 %     Weight 07/15/19 0621 154 lb (69.9 kg)     Height 07/15/19 0621 5\' 4"  (1.626 m)     Head Circumference --      Peak Flow --      Pain Score 07/15/19 0619 8     Pain Loc --      Pain Edu? --      Excl. in [place]? --    Constitutional: Alert and oriented. Well appearing  and in no acute distress. Eyes: Conjunctivae are normal.  Head: Atraumatic.  TMs are dull bilaterally. Neck: No stridor.   Hematological/Lymphatic/Immunilogical: No cervical lymphadenopathy. Cardiovascular: Normal rate, regular rhythm. Grossly normal heart sounds.  Good peripheral circulation. Respiratory: Normal respiratory effort.  No retractions. Lungs CTAB. Gastrointestinal: Soft and nontender. No distention.  Musculoskeletal: No lower extremity tenderness nor edema.  No joint effusions. Neurologic:  Normal speech and language. No gross focal neurologic deficits are appreciated. No gait instability. Skin:  Skin is warm, dry and intact. No rash noted. Psychiatric: Mood and affect are normal. Speech and behavior are normal.  ____________________________________________   LABS (all labs ordered are listed, but only abnormal results are displayed)  Labs  Reviewed  SARS CORONAVIRUS 2 (TAT 6-24 HRS)   RADIOLOGY  Official radiology report(s): Dg Chest 2 View  Result Date: 07/15/2019 CLINICAL DATA:  Cough. EXAM: CHEST - 2 VIEW COMPARISON:  Radiographs of July 10, 2016. FINDINGS: The heart size and mediastinal contours are within normal limits. Both lungs are clear. The visualized skeletal structures are unremarkable. IMPRESSION: No active cardiopulmonary disease. Electronically Signed   By: Lupita Raider M.D.   On: 07/15/2019 07:41    ____________________________________________   PROCEDURES  Procedure(s) performed (including Critical Care):  Procedures   ____________________________________________   INITIAL IMPRESSION / ASSESSMENT AND PLAN / ED COURSE  As part of my medical decision making, I reviewed the following data within the electronic MEDICAL RECORD NUMBER Notes from prior ED visits and [place] Controlled Substance Database  LEYANI GARGUS was evaluated in Emergency Department on 07/15/2019 for the symptoms described in the history of present illness. She was evaluated in the context of the global COVID-19 pandemic, which necessitated consideration that the patient might be at risk for infection with the SARS-CoV-2 virus that causes COVID-19. Institutional protocols and algorithms that pertain to the evaluation of patients at risk for COVID-19 are in a state of rapid change based on information released by regulatory bodies including the CDC and federal and state organizations. These policies and algorithms were followed during the patient's care in the ED.  33 year old female presents to the ED with complaint of 3 days upper respiratory symptoms and body aches.  Patient is concerned as her children were exposed to COVID while at daycare.  She has brought both children and her spouse to be tested for COVID. ____________________________________________   FINAL CLINICAL IMPRESSION(S) / ED DIAGNOSES  Final diagnoses:  Viral illness      ED Discharge Orders    None       Note:  This document was prepared using Dragon voice recognition software and may include unintentional dictation errors.    Tommi Rumps, PA-C 07/15/19 0830    Chesley Noon, MD 07/15/19 [phone]

## 2019-07-26 ENCOUNTER — Ambulatory Visit: Payer: Self-pay

## 2019-08-01 ENCOUNTER — Ambulatory Visit: Payer: Self-pay

## 2019-08-15 ENCOUNTER — Other Ambulatory Visit: Payer: Self-pay

## 2019-08-15 ENCOUNTER — Ambulatory Visit (LOCAL_COMMUNITY_HEALTH_CENTER): Payer: Self-pay | Admitting: Advanced Practice Midwife

## 2019-08-15 ENCOUNTER — Ambulatory Visit: Payer: Self-pay

## 2019-08-15 ENCOUNTER — Encounter: Payer: Self-pay | Admitting: Advanced Practice Midwife

## 2019-08-15 VITALS — BP 116/78 | Ht 64.0 in | Wt 152.0 lb

## 2019-08-15 DIAGNOSIS — N760 Acute vaginitis: Secondary | ICD-10-CM

## 2019-08-15 DIAGNOSIS — Z30011 Encounter for initial prescription of contraceptive pills: Secondary | ICD-10-CM

## 2019-08-15 DIAGNOSIS — Z3009 Encounter for other general counseling and advice on contraception: Secondary | ICD-10-CM

## 2019-08-15 DIAGNOSIS — B9689 Other specified bacterial agents as the cause of diseases classified elsewhere: Secondary | ICD-10-CM

## 2019-08-15 LAB — WET PREP FOR TRICH, YEAST, CLUE
Trichomonas Exam: NEGATIVE
Yeast Exam: NEGATIVE

## 2019-08-15 MED ORDER — METRONIDAZOLE 500 MG PO TABS: 500.0000 mg | ORAL_TABLET | Freq: Two times a day (BID) | ORAL | 0 refills | Status: AC

## 2019-08-15 MED ORDER — NORGESTIM-ETH ESTRAD TRIPHASIC 0.18/0.215/0.25 MG-35 MCG PO TABS: 1.0000 | ORAL_TABLET | Freq: Every day | ORAL | 0 refills | Status: DC

## 2019-08-15 NOTE — Progress Notes (Signed)
Wet mount reviewed with provider and pt treated for BV per Ola Spurr, CNM verbal order. Pt also received Tri Sprintec (Ortho Tri Cyclen) #3 packs per E. Sciora written and verbal order. Pt aware to return in ~3 months for BP check. Provider orders completed.Ronny Bacon, RN

## 2019-08-15 NOTE — Progress Notes (Signed)
   [facility] problem visit  Hayley Kirk  Subjective:  Hayley Kirk is a 33 y.o. being seen today for wants to switch from DMPA to ocp's due to 10 lb wt gain  Chief Complaint  Patient presents with  . Contraception    switch to OCP's    HPI  33 yo BF [id number] nonsmoker with LMP 07/01/19, last sex 3 wks ago with condom, last DMPA 05/08/19, last physical 10/2018.  Last pap 2017 [facility] wnl. No hx blood clots, stroke, MI, cancer Does the patient have a current or past history of drug use? No   No components found for: HCV]   Health Maintenance Due  Topic Date Due  . PAP SMEAR-Modifier  08/31/2018  . INFLUENZA VACCINE  05/11/2019    ROS  The following portions of the patient's history were reviewed and updated as appropriate: allergies, current medications, past family history, past medical history, past social history, past surgical history and problem list. Problem list updated.   See flowsheet for other program required questions.  Objective:   Vitals:   08/15/19 0852  BP: 116/78  Weight: 152 lb (68.9 kg)  Height: 5\' 4"  (1.626 m)    Physical Exam Abdominal:     Comments: Soft without tenderness  Genitourinary:    General: Normal vulva.     Exam position: Lithotomy position.     Labia:        Right: No lesion.        Left: No lesion.      Vagina: Vaginal discharge (white creamy leukorrhea) present.     Cervix: Eversion (moderate ectropion with sl tenderness with pap and erythema to os only) present.     Rectum: Normal.  Lymphadenopathy:     Lower Body: No right inguinal adenopathy. No left inguinal adenopathy.       Assessment and Plan:  Hayley Kirk is a 33 y.o. female presenting to the [facility] for a Women's Health problem visit  1. Family planning Treat wet mount per standing orders Immunization nurse consult - WET PREP FOR [place], YEAST, CLUE - IGP, Aptima HPV  2. Encounter for  initial prescription of contraceptive pills May have OTC #3 I po daily to begin today    Return in about 3 months (around 11/15/2019) for BP check.  No future appointments.  Herbie Saxon, CNM

## 2019-08-15 NOTE — Progress Notes (Signed)
Pt here to switch from Depo to OCP's as she is having weight gain with Depo. Last Depo was 05/08/2019, so pt is 14 weeks and 1 day post last Depo.Ronny Bacon, RN

## 2019-08-23 LAB — IGP, APTIMA HPV
HPV Aptima: NEGATIVE
PAP Smear Comment: 0

## 2020-02-14 ENCOUNTER — Ambulatory Visit: Payer: Self-pay

## 2020-02-27 ENCOUNTER — Other Ambulatory Visit: Payer: Self-pay

## 2020-02-27 ENCOUNTER — Encounter: Payer: Self-pay | Admitting: Physician Assistant

## 2020-02-27 ENCOUNTER — Ambulatory Visit (LOCAL_COMMUNITY_HEALTH_CENTER): Payer: Self-pay | Admitting: Physician Assistant

## 2020-02-27 VITALS — BP 112/78 | Ht 64.0 in | Wt 147.4 lb

## 2020-02-27 DIAGNOSIS — Z3009 Encounter for other general counseling and advice on contraception: Secondary | ICD-10-CM

## 2020-02-27 LAB — PREGNANCY, URINE: Preg Test, Ur: NEGATIVE

## 2020-02-27 MED ORDER — MEDROXYPROGESTERONE ACETATE 150 MG/ML IM SUSP
150.0000 mg | INTRAMUSCULAR | Status: AC
  Administered 2020-02-27 – 2020-09-08 (×3): 150 mg via INTRAMUSCULAR

## 2020-02-27 NOTE — Progress Notes (Signed)
In for visit; desires to change back to Depo due to nausea with O.C.'s; declines screening tests today Sharlette Dense, RN

## 2020-02-27 NOTE — Progress Notes (Signed)
   [facility] problem visit  [facility]  Subjective:  Hayley Kirk is a 34 y.o. being seen today for   Chief Complaint  Patient presents with  . Contraception    34 yo woman here for prob FP visit. Switched from DMPA to COCPs 08/2019 due to concern about weight gain, but has had persistent nausea with OCPs, even with trying to take at different times of day, with or without food. Wants to restart DMPA. LMP 02/17/20, last sex 1 week ago. States she took last pill 1 week ago. Has been taking an OTC supplement "MACA" for 3 days to try to gain weight in her buttocks area.  Does the patient have a current or past history of drug use? No   No components found for: HCV]   There are no preventive care reminders to display for this patient.  Review of Systems  Constitutional: Negative.   Respiratory: Negative.   Cardiovascular: Negative.   Genitourinary: Negative.   Neurological: Negative.   Endo/Heme/Allergies: Negative.     The following portions of the patient's history were reviewed and updated as appropriate: allergies, current medications, past family history, past medical history, past social history, past surgical history and problem list. Problem list updated.   See flowsheet for other program required questions.  Objective:   Vitals:   02/27/20 0850  BP: 112/78  Weight: 147 lb 6.4 oz (66.9 kg)  Height: 5\' 4"  (1.626 m)    Physical Exam Constitutional:      Appearance: Normal appearance.  Pulmonary:     Effort: Pulmonary effort is normal.  Neurological:     Mental Status: She is alert and oriented to person, place, and time.  Psychiatric:        Behavior: Behavior normal.        Thought Content: Thought content normal.        Judgment: Judgment normal.    Assessment and Plan:  Hayley Kirk is a 34 y.o. female presenting to the [facility] Department for a Women's Health problem visit  1. Family planning  services If UPT is neg, give DMPA 150mg  IM today and repeat every 3 months x2, Annual well woman exam due 08/2020. - Pregnancy, urine   No follow-ups on file.  No future appointments.  , PA-C

## 2020-06-09 ENCOUNTER — Ambulatory Visit (LOCAL_COMMUNITY_HEALTH_CENTER): Payer: Self-pay

## 2020-06-09 ENCOUNTER — Other Ambulatory Visit: Payer: Self-pay

## 2020-06-09 VITALS — BP 120/84 | Ht 64.0 in | Wt 146.0 lb

## 2020-06-09 DIAGNOSIS — Z3009 Encounter for other general counseling and advice on contraception: Secondary | ICD-10-CM

## 2020-06-09 DIAGNOSIS — Z30013 Encounter for initial prescription of injectable contraceptive: Secondary | ICD-10-CM

## 2020-06-09 MED ORDER — MULTI-VITAMIN/MINERALS PO TABS: 1.0000 | ORAL_TABLET | Freq: Every day | ORAL | 0 refills | Status: DC

## 2020-06-09 NOTE — Progress Notes (Signed)
Pt is 14 weeks 5 days post depo. DMPA 150mg  IM given today Left UOQ per order by A.Streilein, PA-C dated 02/27/2020. Tolerated well.  Pt due for return physical 08/2020 and depo due 08/25/2020. Pt aware and  given reminder card. Multivitamins dispensed today. 08/27/2020, RN

## 2020-09-08 ENCOUNTER — Other Ambulatory Visit: Payer: Self-pay

## 2020-09-08 ENCOUNTER — Ambulatory Visit (LOCAL_COMMUNITY_HEALTH_CENTER): Payer: Self-pay

## 2020-09-08 VITALS — BP 111/73 | Ht 64.0 in | Wt 155.5 lb

## 2020-09-08 DIAGNOSIS — Z3009 Encounter for other general counseling and advice on contraception: Secondary | ICD-10-CM

## 2020-09-08 DIAGNOSIS — Z3042 Encounter for surveillance of injectable contraceptive: Secondary | ICD-10-CM

## 2020-09-08 NOTE — Progress Notes (Signed)
Pt is 13.0 weeks post depo today. DMPA 150 mg IM administered today per Aundra Millet, PA order dated 02/27/20. Pt counseled that she needs a physical at next visit.

## 2020-11-17 ENCOUNTER — Other Ambulatory Visit: Payer: Self-pay

## 2020-11-24 ENCOUNTER — Other Ambulatory Visit: Payer: Self-pay

## 2020-11-26 ENCOUNTER — Ambulatory Visit: Payer: Self-pay

## 2020-12-03 ENCOUNTER — Ambulatory Visit: Payer: Self-pay

## 2020-12-09 ENCOUNTER — Ambulatory Visit: Payer: Self-pay

## 2020-12-14 ENCOUNTER — Ambulatory Visit: Payer: Self-pay

## 2020-12-17 ENCOUNTER — Ambulatory Visit: Payer: Self-pay

## 2020-12-21 ENCOUNTER — Ambulatory Visit: Payer: Self-pay

## 2020-12-24 ENCOUNTER — Ambulatory Visit: Payer: Self-pay

## 2020-12-25 ENCOUNTER — Ambulatory Visit: Payer: Self-pay

## 2021-01-04 ENCOUNTER — Ambulatory Visit: Payer: Self-pay

## 2021-01-09 IMAGING — US US ABDOMEN LIMITED
1 series · 14 of 25 positions shown · non-contrast
Comparison: CT 11/26/2016

CLINICAL DATA: Upper abdominal pain

EXAM:
ULTRASOUND ABDOMEN LIMITED RIGHT UPPER QUADRANT

[Series 1: us abdomen limited · 0.20mm/px · 14 of 46 slices shown]
[im 1/46]
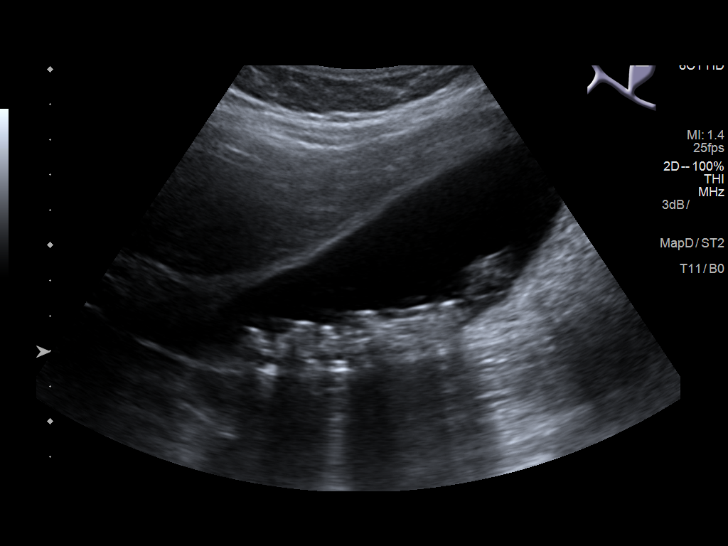
[im 4/46]
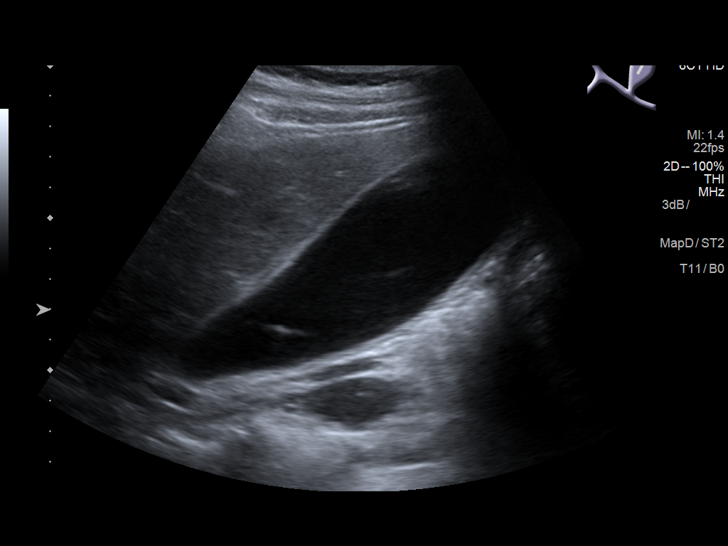
[im 8/46]
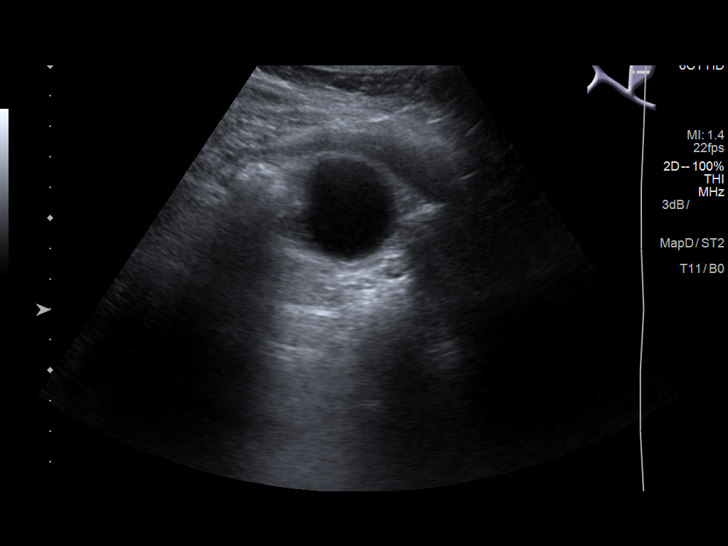
[im 12/46]
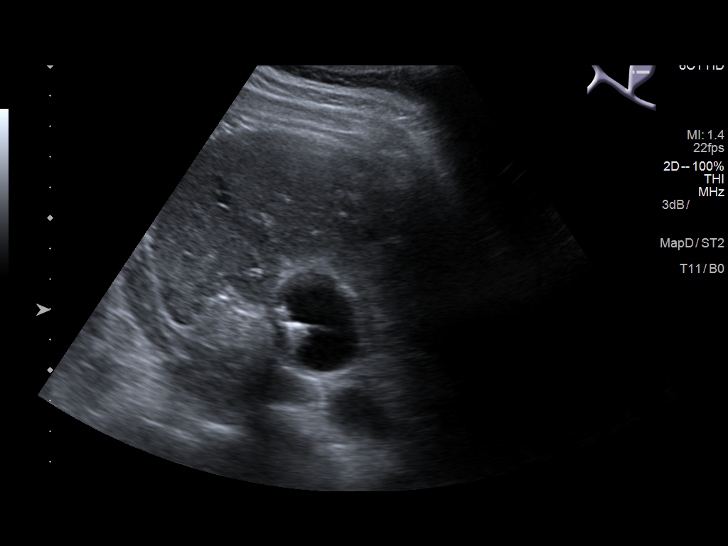
[im 16/46]
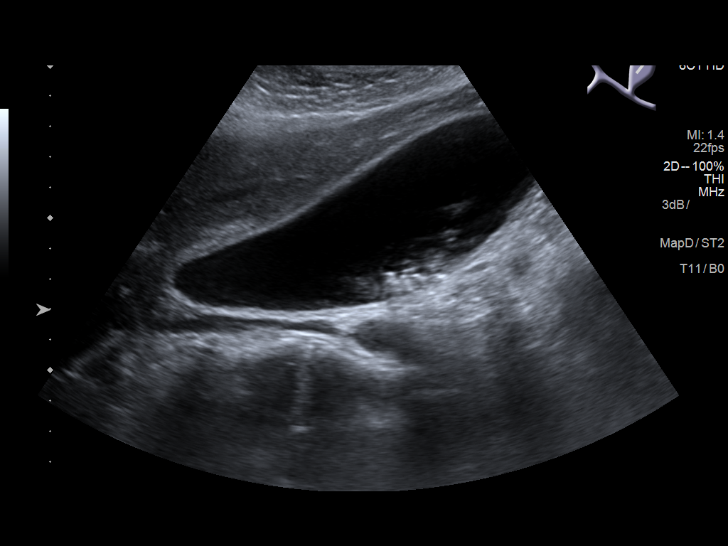
[im 17/46]
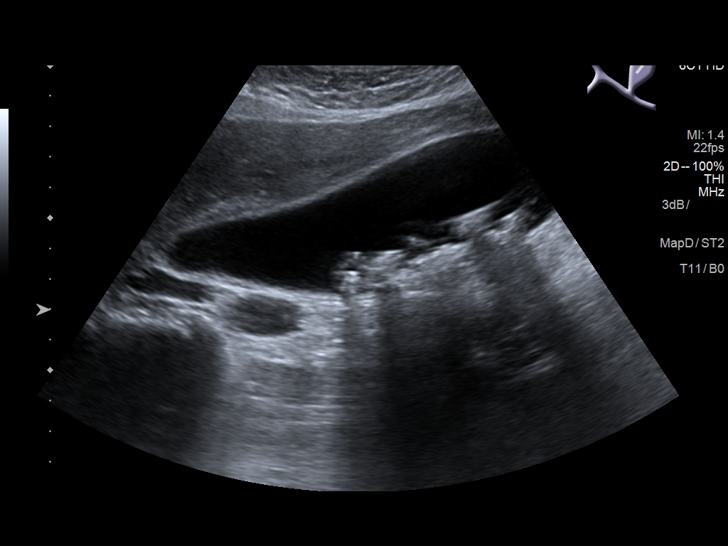
[im 21/46]
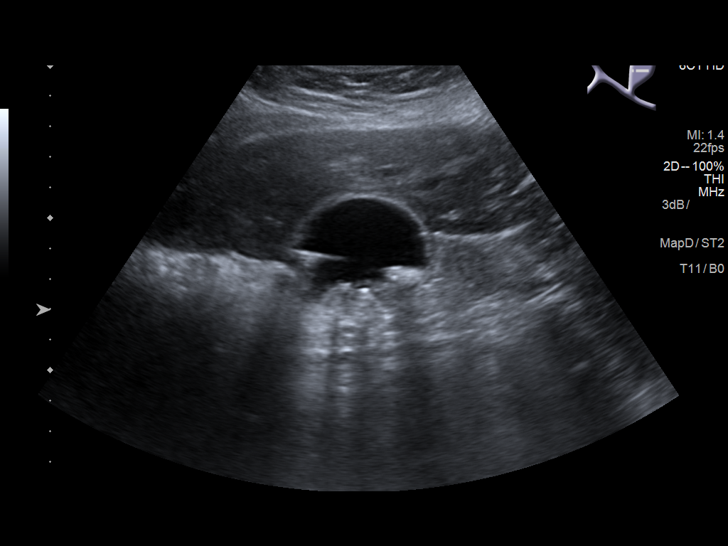
[im 25/46]
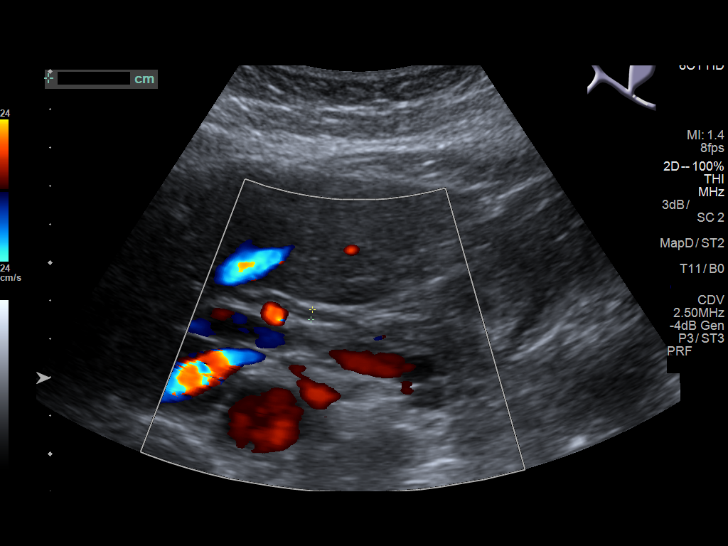
[im 29/46]
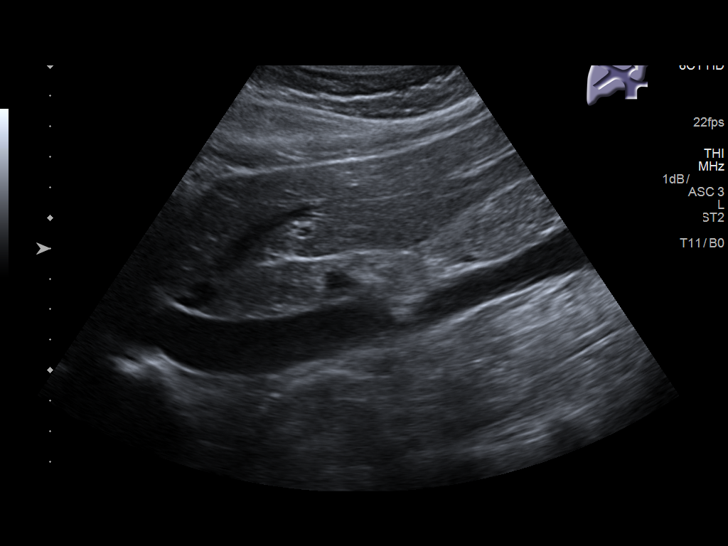
[im 31/46]
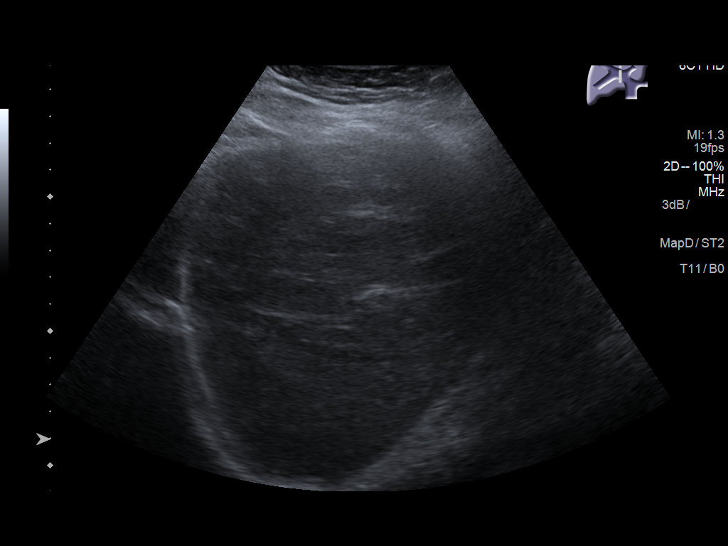
[im 34/46]
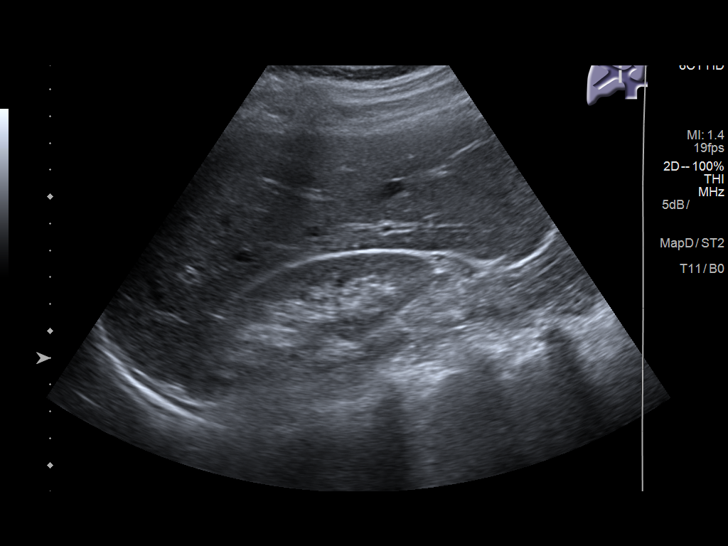
[im 38/46]
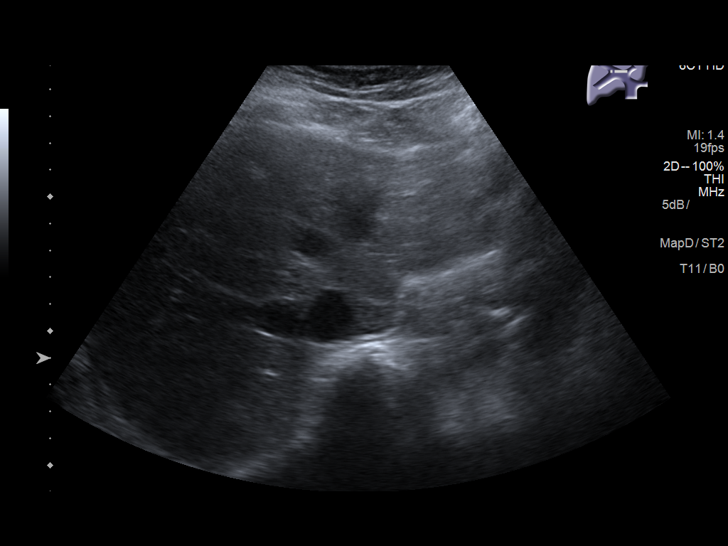
[im 42/46]
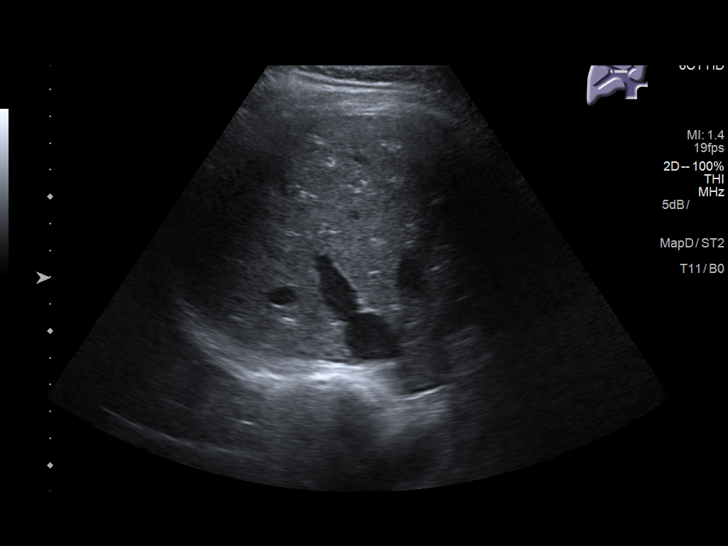
[im 46/46]
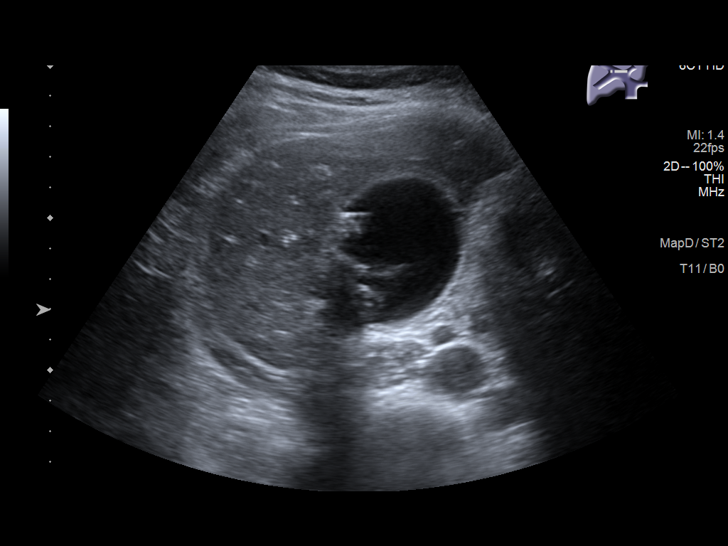

[14 of 25 positions shown; findings below may reference images not displayed]

FINDINGS: Gallbladder:

Numerous mobile gallstones within the gallbladder measuring 14 mm or
less. No wall thickening or pericholecystic fluid. The patient was
tender over the gallbladder during the study.

Common bile duct:

Diameter: Normal caliber, 3 mm

Liver:

No focal lesion identified. Within normal limits in parenchymal
echogenicity. Portal vein is patent on color Doppler imaging with
normal direction of blood flow towards the liver.
IMPRESSION: Cholelithiasis. No wall thickening or pericholecystic fluid, but the
patient was tender over the gallbladder during the study. Can
exclude early acute cholecystitis.

## 2021-01-12 ENCOUNTER — Ambulatory Visit (LOCAL_COMMUNITY_HEALTH_CENTER): Payer: Self-pay | Admitting: Physician Assistant

## 2021-01-12 ENCOUNTER — Ambulatory Visit (LOCAL_COMMUNITY_HEALTH_CENTER): Payer: Self-pay

## 2021-01-12 ENCOUNTER — Other Ambulatory Visit: Payer: Self-pay

## 2021-01-12 VITALS — BP 120/79 | Ht 64.0 in | Wt 156.8 lb

## 2021-01-12 DIAGNOSIS — Z3009 Encounter for other general counseling and advice on contraception: Secondary | ICD-10-CM

## 2021-01-12 DIAGNOSIS — Z23 Encounter for immunization: Secondary | ICD-10-CM

## 2021-01-12 DIAGNOSIS — Z3042 Encounter for surveillance of injectable contraceptive: Secondary | ICD-10-CM

## 2021-01-12 MED ORDER — MEDROXYPROGESTERONE ACETATE 150 MG/ML IM SUSP
150.0000 mg | Freq: Once | INTRAMUSCULAR | Status: AC
  Administered 2021-01-12: 150 mg via INTRAMUSCULAR

## 2021-01-12 NOTE — Progress Notes (Signed)
Provider orders completed.  Pt encouraged to set up annual PE.

## 2021-01-12 NOTE — Progress Notes (Signed)
Tolerated flu vaccine well. Updated [facility] copy given and explained. Escorted to [facility] for appt. Jerel Shepherd, RN

## 2021-01-12 NOTE — Progress Notes (Signed)
   [facility] problem visit  [facility]  Subjective:  Hayley Kirk is a 35 y.o. being seen today for to restart Depo.   Chief Complaint  Patient presents with  . Contraception    Needs Depo    HPI  Patient states that she is here for her Depo shot.  States that she has not had sex for about 4 months.  Denies irregular bleeding and states that she only wants her Depo today.    Does the patient have a current or past history of drug use? No   No components found for: HCV]   Health Maintenance Due  Topic Date Due  . Hepatitis C Screening  Never done  . COVID-19 Vaccine (1) Never done    Review of Systems  All other systems reviewed and are negative.   The following portions of the patient's history were reviewed and updated as appropriate: allergies, current medications, past family history, past medical history, past social history, past surgical history and problem list. Problem list updated.   See flowsheet for other program required questions.  Objective:   Vitals:   01/12/21 1114  BP: 120/79  Weight: 156 lb 12.8 oz (71.1 kg)  Height: 5\' 4"  (1.626 m)    Physical Exam Vitals and nursing note reviewed.  Constitutional:      General: She is not in acute distress.    Appearance: Normal appearance.  HENT:     Head: Normocephalic and atraumatic.  Eyes:     Conjunctiva/sclera: Conjunctivae normal.  Pulmonary:     Effort: Pulmonary effort is normal.  Skin:    General: Skin is warm and dry.  Neurological:     Mental Status: She is alert and oriented to person, place, and time.  Psychiatric:        Mood and Affect: Mood normal.        Behavior: Behavior normal.        Thought Content: Thought content normal.        Judgment: Judgment normal.       Assessment and Plan:  Hayley Kirk is a 35 y.o. female presenting to the [facility] Department for a Women's Health problem visit  1. Encounter for  counseling regarding contraception Counseled patient re: normal SE of Depo and when to call clinic for concerns. Reviewed with patient that she should use condoms as a back up for 2 weeks after shot today. Counseled patient that due to program requirements she will need to set up a "physical/annual" visit when her next Depo is due. Counseled that at that visit, we will update her history and do whatever part of a PE is due at that time. Enc patient that when she calls for her next appointment, to say she needs an appt for her physical and we will know that she also needs her Depo.   2. Surveillance for Depo-Provera contraception OK for Depo 150 mg IM today. (pt requests in her hip). - medroxyPROGESTERone (DEPO-PROVERA) injection 150 mg     No follow-ups on file.  No future appointments.  [facility]

## 2021-08-02 ENCOUNTER — Other Ambulatory Visit: Payer: Self-pay

## 2021-08-02 ENCOUNTER — Emergency Department
Admission: EM | Admit: 2021-08-02 | Discharge: 2021-08-02 | Disposition: A | Discharge status: Home / Self Care | Payer: Self-pay | Attending: Orthopedic Surgery | Admitting: Orthopedic Surgery

## 2021-08-02 DIAGNOSIS — M5416 Radiculopathy, lumbar region: Secondary | ICD-10-CM

## 2021-08-02 DIAGNOSIS — N644 Mastodynia: Secondary | ICD-10-CM

## 2021-08-02 MED ORDER — PREDNISONE 10 MG PO TABS: 10.0000 mg | ORAL_TABLET | Freq: Every day | ORAL | 0 refills | Status: DC

## 2021-08-02 NOTE — ED Provider Notes (Signed)
[facility] EMERGENCY DEPARTMENT Provider Note   CSN: [id number] Arrival date & time: 08/02/21  2057     History Chief Complaint  Patient presents with   Breast Pain    Hayley Kirk is a 35 y.o. female.  Presents to the emergency department for evaluation of right breast pain, right leg pain.  She describes soreness to the right breast with no palpable masses, warmth or redness.  Pain is worse with movement.  Symptoms been present for 2 weeks.  She is states the symptoms are intermittent.  She denies any chest pain, shortness of breath, fevers.  No trauma or injury.  She has taken occasional ibuprofen 2 or 400 mg twice over the last 2 weeks with no relief.  She also complains of some pain radiating down her right leg that is intermittent.  She denies any swelling warmth or redness.  No history of blood clots.  She is ambulatory with no antalgic gait.  No trauma or injury.  HPI     Past Medical History:  Diagnosis Date   Kidney stone    Migraines    hx of migraines    Patient Active Problem List   Diagnosis Date Noted   Cholecystitis    Upper abdominal pain    History of depression 04/09/2018   History of anxiety 04/09/2018   Pleuritic chest pain 05/11/2016   UTI (lower urinary tract infection) 05/11/2016   Family history of breast cancer in mother 10/29/2015    Past Surgical History:  Procedure Laterality Date   DILATION AND CURETTAGE OF UTERUS     LITHOTRIPSY       OB History     Gravida  7   Para  5   Term  4   Preterm  1   AB  2   Living  5      SAB  1   IAB  1   Ectopic  0   Multiple  0   Live Births  5        Obstetric Comments  Pt told after miscarriage that she could not have any more children "punch a hole in cervix?"         Family History  Problem Relation Age of Onset   Breast cancer Mother        stage 4   Stomach cancer Mother    Diabetes Mother    Hyperlipidemia Mother    Hypertension Mother     Stroke Mother    Heart failure Father    Breast cancer Maternal Aunt        x2   Stomach cancer Maternal Aunt        passed away 59yrs ago   Migraines Sister     Social History   Tobacco Use   Smoking status: Never   Smokeless tobacco: Never  Substance Use Topics   Alcohol use: No    Alcohol/week: 0.0 standard drinks   Drug use: No    Home Medications Prior to Admission medications   Medication Sig Start Date End Date Taking? Authorizing Provider  ferrous sulfate 325 (65 FE) MG EC tablet Take 325 mg by mouth 3 (three) times daily with meals.    [provider]  Multiple Vitamins-Minerals (MULTIVITAMIN WITH MINERALS) tablet Take 1 tablet by mouth daily. Patient not taking: Reported on 01/12/2021 06/09/20   Federico Flake, MD  Multiple Vitamins-Minerals (WOMENS MULTIVITAMIN) TABS Take 1 tablet by mouth daily.  [provider]  Norgestimate-Ethinyl Estradiol Triphasic (TRI-SPRINTEC) 0.18/0.215/0.25 MG-35 MCG tablet Take 1 tablet by mouth daily. Patient not taking: No sig reported 08/15/19   Alberteen Spindle, CNM  predniSONE (DELTASONE) 10 MG tablet Take 1 tablet (10 mg total) by mouth daily. 6,5,4,3,2,1 six day taper 08/02/21  Yes Evon Slack, PA-C  Specialty Vitamins Products (HAIR NOURISHING SUPPLEMENT PO) Take 2 tablets by mouth daily.    [provider]    Allergies    Patient has no known allergies.  Review of Systems   Review of Systems  Constitutional:  Negative for activity change, chills and fever.  Eyes:  Negative for pain and visual disturbance.  Respiratory:  Negative for cough, chest tightness and shortness of breath.   Cardiovascular:  Negative for chest pain and leg swelling.  Gastrointestinal:  Negative for abdominal pain, nausea and vomiting.  Genitourinary:  Negative for flank pain and pelvic pain.  Musculoskeletal:  Positive for back pain and myalgias. Negative for arthralgias, gait problem, joint swelling, neck  pain and neck stiffness.  Skin:  Negative for rash and wound.  Neurological:  Positive for numbness. Negative for dizziness, syncope, weakness, light-headedness and headaches.  Psychiatric/Behavioral:  Negative for confusion and decreased concentration.    Physical Exam Updated Vital Signs BP (!) 135/110 (BP Location: Left Arm)   Pulse 80   Temp 98.1 F (36.7 C) (Oral)   Resp 20   Ht 5\' 4"  (1.626 m)   Wt 70.8 kg   LMP  (LMP Unknown)   SpO2 100%   BMI 26.78 kg/m   Physical Exam Constitutional:      Appearance: She is well-developed.  HENT:     Head: Normocephalic and atraumatic.  Eyes:     Conjunctiva/sclera: Conjunctivae normal.  Cardiovascular:     Rate and Rhythm: Normal rate.     Heart sounds: Normal heart sounds.  Pulmonary:     Effort: Pulmonary effort is normal. No respiratory distress.  Musculoskeletal:        General: No swelling or tenderness. Normal range of motion.     Cervical back: Normal range of motion.     Right lower leg: No edema.     Left lower leg: No edema.     Comments: Chest wall nontender to palpation.  She has some subtle tenderness to the right breast with no warmth redness or induration.  No signs of any infectious process.  No palpable masses but she has very dense fibrotic tissue.  Has no tenderness throughout the lower lumbar spine, full range of motion of bilateral hips with no discomfort.  She has no swelling warmth erythema or edema throughout bilateral lower extremities with negative ' sign bilaterally.  She ambulates with no antalgic gait and no assistive devices.  Negative straight leg raise bilaterally.  No weakness or neurological deficits in the lower extremities.  Skin:    General: Skin is warm.     Findings: No rash.  Neurological:     General: No focal deficit present.     Mental Status: She is alert and oriented to person, place, and time.  Psychiatric:        Behavior: Behavior normal.        Thought Content: Thought  content normal.    ED Results / Procedures / Treatments   Labs (all labs ordered are listed, but only abnormal results are displayed) Labs Reviewed - No data to display  EKG None  Radiology No results found.  Procedures  Procedures   Medications Ordered in ED Medications - No data to display  ED Course  I have reviewed the triage vital signs and the nursing notes.  Pertinent labs & imaging results that were available during my care of the patient were reviewed by me and considered in my medical decision making (see chart for details).    MDM Rules/Calculators/A&P                         35 year old female with 2 weeks of intermittent right breast tissue pain with no masses, warmth redness or lesions.  She is also had intermittent right leg pain consistent with sciatica, describes radiating pain with intermittent numbness rating down the right leg.  No weakness or neurological deficits.  No trauma or injury.  Patient will be placed on a 6-day steroid taper to help with inflammation.  Encouraged her to wear a supportive bra.  She will resume ibuprofen after completion of steroid taper and follow-up with PCP or GYN in 1 week if no relief.  She understands signs symptoms return to the ER for such as any increasing pain, redness, warmth, masses, weakness or any urgent changes in her health. Final Clinical Impression(s) / ED Diagnoses Final diagnoses:  Breast pain, right  Right lumbar radiculopathy    Rx / DC Orders ED Discharge Orders          Ordered    predniSONE (DELTASONE) 10 MG tablet  Daily        08/02/21 2114             Ronnette Juniper 08/02/21 2119    Sharman Cheek, MD 08/02/21 2328

## 2021-08-02 NOTE — Discharge Instructions (Signed)
Please take medication as prescribed.  After completion of 6-day steroid taper you can resume ibuprofen 600 mg 3 times daily.  If no improvement in 1 week follow-up with primary care provider or GYN physician.  Return to the ER for any fevers, worsening symptoms or urgent changes in health.

## 2021-08-02 NOTE — ED Notes (Signed)
Pt was evaluated and d/c by provider in triage

## 2021-08-02 NOTE — ED Triage Notes (Signed)
Pt in with co right breast pain x 2 weeks, states may feel lump. Denies any hx of the same, pt also co right leg pain for 2 weeks. Pt states pain starts in right foot and radiates up to hip. Pt denies any hx of the same or injury.

## 2022-02-25 ENCOUNTER — Ambulatory Visit (LOCAL_COMMUNITY_HEALTH_CENTER): Payer: Self-pay

## 2022-02-25 VITALS — BP 123/83 | Ht 64.0 in | Wt 145.0 lb

## 2022-02-25 DIAGNOSIS — Z3201 Encounter for pregnancy test, result positive: Secondary | ICD-10-CM

## 2022-02-25 LAB — PREGNANCY, URINE: Preg Test, Ur: POSITIVE — AB

## 2022-02-25 MED ORDER — PRENATAL 27-0.8 MG PO TABS: 1.0000 | ORAL_TABLET | Freq: Every day | ORAL | 0 refills | Status: AC

## 2022-02-25 NOTE — Progress Notes (Signed)
UPT positive.  Plans prenatal Care at [facility] To [facility] for preadmit.    Resource packet provided.    Alanda Colton Shelda Pal, RN

## 2022-03-29 ENCOUNTER — Encounter: Payer: Self-pay | Admitting: Advanced Practice Midwife

## 2022-03-29 ENCOUNTER — Ambulatory Visit: Payer: Medicaid Other | Admitting: Advanced Practice Midwife

## 2022-03-29 DIAGNOSIS — O0991 Supervision of high risk pregnancy, unspecified, first trimester: Secondary | ICD-10-CM

## 2022-03-29 DIAGNOSIS — Z8751 Personal history of pre-term labor: Secondary | ICD-10-CM | POA: Diagnosis not present

## 2022-03-29 LAB — URINALYSIS
Bilirubin, UA: NEGATIVE
Glucose, UA: NEGATIVE
Leukocytes,UA: NEGATIVE
Nitrite, UA: NEGATIVE
Protein,UA: NEGATIVE
RBC, UA: NEGATIVE
Specific Gravity, UA: 1.03 (ref 1.005–1.030)
Urobilinogen, Ur: 0.2 mg/dL (ref 0.2–1.0)
pH, UA: 6 (ref 5.0–7.5)

## 2022-03-29 LAB — WET PREP FOR TRICH, YEAST, CLUE
Trichomonas Exam: NEGATIVE
Yeast Exam: NEGATIVE

## 2022-03-29 LAB — HEMOGLOBIN, FINGERSTICK: Hemoglobin: 12 g/dL (ref 11.1–15.9)

## 2022-03-29 NOTE — Progress Notes (Signed)
[facility]  Maternal Health Clinic   INITIAL PRENATAL VISIT NOTE  Subjective:  Hayley Kirk is a 36 y.o. SBF exsmoker [id number] (13, 12, 10, 5, 3) at [redacted]w[redacted]d being seen today to start prenatal care at the [facility] Department. She feels "this one snuck up on me because since I am in health care, I thought I had covid" about unplanned pregnancy on ocp's but noncompliant. 34 yo employed FOB feels "excited" about pregnancy and they share her 34 yo son; in supportive 4 year relationship. She is employed and works for [employer] 40 hrs/wk and plans to enroll in [employer] in 2024 to get an LPN. She is living with FOB and her 5 kids who she has shared custody with.  Unsure LMP sometime in 12/2021. Last cig age 72. Denies vaping, cigars, MJ. Last ETOH 3 years ago (2 glasses wine) "rarely". Last dental exam 2 years ago. Says she feels FM. Last pap 08/15/2019 neg HPV neg. Denies ER use or u/s this pregnancy.  She is currently monitored for the following issues for this high-risk pregnancy and has Family history of breast cancer in mother; History of pp depression 2008-2009; History of anxiety; Cholecystitis; History of premature delivery 35.0 wks after SROM 5# 06/09/09; and Supervision of high risk pregnancy in first trimester on their problem list.  Patient reports no complaints.  Contractions: Not present. Vag. Bleeding: None.  Movement: Present. Denies leaking of fluid.   Indications for ASA therapy (per uptodate) One of the following: Previous pregnancy with preeclampsia, especially early onset and with an adverse outcome No Multifetal gestation No Chronic hypertension No Type 1 or 2 diabetes mellitus No Chronic kidney disease No Autoimmune disease (antiphospholipid syndrome, systemic lupus erythematosus) No  Two or more of the following: Nulliparity No Obesity (body mass index >30 kg/m2) No Family history of preeclampsia in mother or sister No Age ?35 years No Sociodemographic  characteristics (African American race, low socioeconomic level) Yes Personal risk factors (eg, previous pregnancy with low birth weight or small for gestational age infant, previous adverse pregnancy outcome [eg, stillbirth], interval >10 years between pregnancies) No   The following portions of the patient's history were reviewed and updated as appropriate: allergies, current medications, past family history, past medical history, past social history, past surgical history and problem list. Problem list updated.  Objective:   Vitals:   03/29/22 1309 03/29/22 1311  BP: 107/71   Pulse: (!) 52   Temp: (!) 97.2 F (36.2 C)   Weight: 142 lb (64.4 kg)   Height:  5' 3.5" (1.613 m)    Fetal Status: Fetal Heart Rate (bpm): 160 Fundal Height: 12 cm Movement: Present  Presentation: Undeterminable   Physical Exam Vitals and nursing note reviewed.  Constitutional:      General: She is not in acute distress.    Appearance: Normal appearance. She is well-developed and normal weight.  HENT:     Head: Normocephalic and atraumatic.     Right Ear: External ear normal.     Left Ear: External ear normal.     Nose: Nose normal. No congestion or rhinorrhea.     Mouth/Throat:     Lips: Pink.     Mouth: Mucous membranes are moist.     Dentition: Normal dentition. No dental caries.     Pharynx: Oropharynx is clear. Uvula midline.     Comments: Dentition: fair; last dental exam 2 years ago Eyes:     General: No scleral icterus.  Conjunctiva/sclera: Conjunctivae normal.  Neck:     Thyroid: No thyroid mass, thyromegaly or thyroid tenderness.  Cardiovascular:     Rate and Rhythm: Normal rate.     Pulses: Normal pulses.     Comments: Extremities are warm and well perfused Pulmonary:     Effort: Pulmonary effort is normal.     Breath sounds: Normal breath sounds.  Chest:     Chest wall: No mass.  Breasts:    Tanner Score is 5.     Breasts are symmetrical.     Right: Normal. No mass, nipple  discharge or skin change.     Left: Normal. No mass, nipple discharge or skin change.  Abdominal:     Palpations: Abdomen is soft.     Tenderness: There is no abdominal tenderness.     Comments: Gravid, soft without masses or tenderness, poor tone  Genitourinary:    General: Normal vulva.     Exam position: Lithotomy position.     Pubic Area: No rash.      Labia:        Right: No rash.        Left: No rash.      Vagina: Vaginal discharge (white creamy leukorrhea, ph<4.5) present.     Cervix: Normal.     Uterus: Enlarged (Gravid 12 wks size, FHR=160). Not tender.      Rectum: Normal. No external hemorrhoid.  Musculoskeletal:     Right lower leg: No edema.     Left lower leg: No edema.  Lymphadenopathy:     Cervical: No cervical adenopathy.     Upper Body:     Right upper body: No axillary adenopathy.     Left upper body: No axillary adenopathy.  Skin:    General: Skin is warm.     Capillary Refill: Capillary refill takes less than 2 seconds.  Neurological:     Mental Status: She is alert.     Assessment and Plan:  Pregnancy: [id number] at [redacted]w[redacted]d  1. History of premature delivery 35 wks after SROM 06/09/09   2. Supervision of high risk pregnancy in first trimester Pt counseled on weight gain of 25-30 lbs Dating u/s ordered with NIPS - HIV-1/HIV-2 Qualitative RNA - Prenatal profile without Varicella/Rubella (295188) - HCV Ab w Reflex to Quant PCR - Urine Culture - Chlamydia/GC NAA, Confirmation - Hemoglobinopathy evaluation -416606 - Hgb A1c w/o eAG - [id number] Drug Screen - WET PREP FOR TRICH, YEAST, CLUE - Urinalysis (Urine Dip) - Hemoglobin, venipuncture    Discussed overview of care and coordination with inpatient delivery practices including [facility], [facility], [facility] and [facility].   Reviewed Centering pregnancy as standard of care at [facility]   Preterm labor symptoms and general obstetric precautions including but not limited to vaginal bleeding,  contractions, leaking of fluid and fetal movement were reviewed in detail with the patient.  Please refer to After Visit Summary for other counseling recommendations.   Return in about 4 weeks (around 04/26/2022) for routine PNC.  Future Appointments  Date Time Provider Department Center  05/25/2022 10:00 AM Linzie Collin, MD [facility] None    Alberteen Spindle, CNM

## 2022-03-29 NOTE — Progress Notes (Signed)
Patient changed delivery provider from [facility] to [facility]. Reports weight loss since pregnant. Delynn Flavin RN

## 2022-03-31 LAB — CBC/D/PLT+RPR+RH+ABO+AB SCR
Antibody Screen: NEGATIVE
Basophils Absolute: 0 10*3/uL (ref 0.0–0.2)
Basos: 0 %
EOS (ABSOLUTE): 0.3 10*3/uL (ref 0.0–0.4)
Eos: 5 %
Hematocrit: 38.2 % (ref 34.0–46.6)
Hemoglobin: 12.7 g/dL (ref 11.1–15.9)
Hepatitis B Surface Ag: NEGATIVE
Immature Grans (Abs): 0 10*3/uL (ref 0.0–0.1)
Immature Granulocytes: 0 %
Lymphocytes Absolute: 1.8 10*3/uL (ref 0.7–3.1)
Lymphs: 29 %
MCH: 31.8 pg (ref 26.6–33.0)
MCHC: 33.2 g/dL (ref 31.5–35.7)
MCV: 96 fL (ref 79–97)
Monocytes Absolute: 0.3 10*3/uL (ref 0.1–0.9)
Monocytes: 6 %
Neutrophils Absolute: 3.6 10*3/uL (ref 1.4–7.0)
Neutrophils: 60 %
Platelets: 214 10*3/uL (ref 150–450)
RBC: 4 x10E6/uL (ref 3.77–5.28)
RDW: 11.9 % (ref 11.7–15.4)
RPR Ser Ql: NONREACTIVE
Rh Factor: POSITIVE
WBC: 6 10*3/uL (ref 3.4–10.8)

## 2022-03-31 LAB — 789231 7+OXYCODONE-BUND
Amphetamines, Urine: NEGATIVE ng/mL
BENZODIAZ UR QL: NEGATIVE ng/mL
Barbiturate screen, urine: NEGATIVE ng/mL
Cannabinoid Quant, Ur: NEGATIVE ng/mL
Cocaine (Metab.): NEGATIVE ng/mL
OPIATE SCREEN URINE: NEGATIVE ng/mL
Oxycodone/Oxymorphone, Urine: NEGATIVE ng/mL
PCP Quant, Ur: NEGATIVE ng/mL

## 2022-03-31 LAB — HIV-1/HIV-2 QUALITATIVE RNA
HIV-1 RNA, Qualitative: NONREACTIVE
HIV-2 RNA, Qualitative: NONREACTIVE

## 2022-03-31 LAB — HGB A1C W/O EAG: Hgb A1c MFr Bld: 5.2 % (ref 4.8–5.6)

## 2022-03-31 LAB — HCV AB W REFLEX TO QUANT PCR: HCV Ab: NONREACTIVE

## 2022-03-31 LAB — HGB FRACTIONATION CASCADE
Hgb A2: 3.1 % (ref 1.8–3.2)
Hgb A: 96.9 % (ref 96.4–98.8)
Hgb F: 0 % (ref 0.0–2.0)
Hgb S: 0 %

## 2022-03-31 LAB — URINE CULTURE: Organism ID, Bacteria: NO GROWTH

## 2022-03-31 LAB — CHLAMYDIA/GC NAA, CONFIRMATION
Chlamydia trachomatis, NAA: NEGATIVE
Neisseria gonorrhoeae, NAA: NEGATIVE

## 2022-03-31 LAB — HCV INTERPRETATION

## 2022-04-22 ENCOUNTER — Encounter: Payer: Self-pay | Admitting: Advanced Practice Midwife

## 2022-04-22 DIAGNOSIS — Z8249 Family history of ischemic heart disease and other diseases of the circulatory system: Secondary | ICD-10-CM | POA: Insufficient documentation

## 2022-04-26 ENCOUNTER — Ambulatory Visit: Payer: Medicaid Other | Admitting: Advanced Practice Midwife

## 2022-04-26 VITALS — BP 109/73 | HR 84 | Temp 97.7°F | Wt 146.2 lb

## 2022-04-26 DIAGNOSIS — O0992 Supervision of high risk pregnancy, unspecified, second trimester: Secondary | ICD-10-CM | POA: Diagnosis not present

## 2022-04-26 DIAGNOSIS — Z8659 Personal history of other mental and behavioral disorders: Secondary | ICD-10-CM | POA: Diagnosis not present

## 2022-04-26 DIAGNOSIS — O0991 Supervision of high risk pregnancy, unspecified, first trimester: Secondary | ICD-10-CM

## 2022-04-26 DIAGNOSIS — Z8751 Personal history of pre-term labor: Secondary | ICD-10-CM | POA: Diagnosis not present

## 2022-04-26 NOTE — Progress Notes (Signed)
[facility] Department Maternal Health Clinic  PRENATAL VISIT NOTE  Subjective:  Hayley Kirk is a 36 y.o. [id number] at 102w2d being seen today for ongoing prenatal care.  She is currently monitored for the following issues for this high-risk pregnancy and has Family history of breast cancer in mother; History of pp depression 2008-2009; History of anxiety; Cholecystitis; History of premature delivery 35.0 wks after SROM 5# 06/09/09; Supervision of high risk pregnancy in first trimester; and Family history of MI (dad died age 44, m. aunt died 88, mom MI age 59) on their problem list.  Patient reports no complaints.  Contractions: Not present. Vag. Bleeding: None.  Movement: Present. Denies leaking of fluid/ROM.   The following portions of the patient's history were reviewed and updated as appropriate: allergies, current medications, past family history, past medical history, past social history, past surgical history and problem list. Problem list updated.  Objective:   Vitals:   04/26/22 1546  BP: 109/73  Pulse: 84  Temp: 97.7 F (36.5 C)  Weight: 146 lb 3.2 oz (66.3 kg)    Fetal Status: Fetal Heart Rate (bpm): 155 Fundal Height: 16 cm Movement: Present     General:  Alert, oriented and cooperative. Patient is in no acute distress.  Skin: Skin is warm and dry. No rash noted.   Cardiovascular: Normal heart rate noted  Respiratory: Normal respiratory effort, no problems with respiration noted  Abdomen: Soft, gravid, appropriate for gestational age.  Pain/Pressure: Absent     Pelvic: Cervical exam deferred        Extremities: Normal range of motion.  Edema: None  Mental Status: Normal mood and affect. Normal behavior. Normal judgment and thought content.   Assessment and Plan:  Pregnancy: [id number] at [redacted]w[redacted]d  1. Supervision of high risk pregnancy in first trimester Anatomy u/s ordered -9 lb 12.8 oz (-4.445 kg) Denies N&V Working 40 hrs/wk Walking 5x/wk x 30 min Reviewed  03/31/22 u/s at 12 4/7 with posterior placenta, AFI wnl, EDC=10/09/22 Anatomy u/s ordered Pt desires AFP only today Dental referral written and pt has apt tomorrow NIPS 04/08/22=neg at genetic counseling apt  2. History of premature delivery 35.0 wks after SROM 5# 06/09/09 monitor  3. History of pp depression 2008-2009   4. History of anxiety Denies need for counseling   Preterm labor symptoms and general obstetric precautions including but not limited to vaginal bleeding, contractions, leaking of fluid and fetal movement were reviewed in detail with the patient. Please refer to After Visit Summary for other counseling recommendations.  Return in about 4 weeks (around 05/24/2022) for routine PNC.  Future Appointments  Date Time Provider Department Center  05/25/2022 10:00 AM Linzie Collin, MD [facility] None    Alberteen Spindle, CNM

## 2022-04-26 NOTE — Progress Notes (Addendum)
Patient is here for MH RV at 16w 5d.   AFP lab completed today.   Patient states she needs a dental referral to have some dental extraction done. Dental list given to patient. Provider made aware and dental referral handed to patient and copy made for scanning.   Patient states she called and made dental appt for Dr. Alysia Penna on 04/27/22. Original order of dental referral handed to patient to give to dental office. Dental office called to get their fax number and send form, however their secretary states there is no need that it is okay for patient to just take the referral to them.   Earlyne Iba, RN

## 2022-04-28 LAB — AFP, SERUM, OPEN SPINA BIFIDA
AFP MoM: 0.84
AFP Value: 33.3 ng/mL
Gest. Age on Collection Date: 16.2 weeks
Maternal Age At EDD: 34.8 yr
OSBR Risk 1 IN: 10000
Test Results:: NEGATIVE
Weight: 146 [lb_av]

## 2022-05-06 NOTE — Addendum Note (Signed)
Addended by: Heywood Bene on: 05/06/2022 09:23 AM   Modules accepted: Orders

## 2022-05-24 ENCOUNTER — Ambulatory Visit: Payer: Medicaid Other | Admitting: Advanced Practice Midwife

## 2022-05-24 VITALS — BP 107/61 | HR 74 | Temp 97.7°F | Wt 149.8 lb

## 2022-05-24 DIAGNOSIS — Z8751 Personal history of pre-term labor: Secondary | ICD-10-CM | POA: Diagnosis not present

## 2022-05-24 DIAGNOSIS — O0991 Supervision of high risk pregnancy, unspecified, first trimester: Secondary | ICD-10-CM

## 2022-05-24 DIAGNOSIS — Z8659 Personal history of other mental and behavioral disorders: Secondary | ICD-10-CM | POA: Diagnosis not present

## 2022-05-24 DIAGNOSIS — O0992 Supervision of high risk pregnancy, unspecified, second trimester: Secondary | ICD-10-CM

## 2022-05-24 LAB — HEMOGLOBIN, FINGERSTICK: Hemoglobin: 11.8 g/dL (ref 11.1–15.9)
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
## 2022-05-24 NOTE — Progress Notes (Signed)
Hgb reviewed during clinic visit - no treatment indicated.   [facility] [place] Anat referral faxed with confirmation.   Earlyne Iba, RN

## 2022-05-25 ENCOUNTER — Encounter: Payer: Medicaid Other | Admitting: Obstetrics and Gynecology

## 2022-05-25 DIAGNOSIS — O0992 Supervision of high risk pregnancy, unspecified, second trimester: Secondary | ICD-10-CM

## 2022-05-25 DIAGNOSIS — Z7689 Persons encountering health services in other specified circumstances: Secondary | ICD-10-CM

## 2022-05-25 DIAGNOSIS — Z3A20 20 weeks gestation of pregnancy: Secondary | ICD-10-CM

## 2022-05-25 NOTE — Progress Notes (Signed)
FMLA faxed to number on form. FMLA paperwork in accordian folder - given to patient next MH RV.   Earlyne Iba, RN

## 2022-05-30 ENCOUNTER — Telehealth: Payer: Self-pay

## 2022-05-30 NOTE — Telephone Encounter (Signed)
[facility] [place] referral faxed 05/25/2022. Fax received today from [facility] (after 3 unsuccessful attempts to contact her by phone to schedule appt) stating unable to contact her.  Call to client with above information and left message requesting she call [facility] so we can provide her the number for [facility] so she can schedule [place] appt. Number to call provided. Jossie Ng, RN

## 2022-05-31 NOTE — Telephone Encounter (Signed)
Telephone call to patient today regarding [facility] U/S appointment and information needed.  Child answered phone and was instructed to say she was at work.  I inquired about what time would she be home and she reported she said to say 10 o'clock.  I asked the child to please have her to call the [facility] Department, that we had some important information to share with her.  The child said yes.  Hart Carwin, RN

## 2022-06-01 NOTE — Telephone Encounter (Signed)
Return call by patient to report she has an U/S appointment tomorrow 06-02-2022 at [facility] at 11 am (10:45 arrival time).  Hart Carwin, RN

## 2022-06-01 NOTE — Telephone Encounter (Signed)
Telephone call to patient today regarding the need to schedule an U/S appointment with [facility].  Left a message that [facility] has tried 3 attempts to contact her and that she needs to call [facility] direct to schedule her U/S appointment as soon as possible.  Number [phone] provided for patient to call to schedule her [facility] U/S.  Hart Carwin, RN

## 2022-06-03 ENCOUNTER — Encounter: Payer: Self-pay | Admitting: Advanced Practice Midwife

## 2022-06-03 ENCOUNTER — Telehealth: Payer: Self-pay | Admitting: Advanced Practice Midwife

## 2022-06-03 DIAGNOSIS — O3432 Maternal care for cervical incompetence, second trimester: Secondary | ICD-10-CM | POA: Insufficient documentation

## 2022-06-03 NOTE — Telephone Encounter (Signed)
T/C to pt with voicemail saying she can't answer phone and voicemail box not set up. T/C to emergency contact: pt's sister Hayley Kirk to ask if she can get in touch with pt and ask her to call me back. Rosey Bath got [employer] mother on the phone and asked if I could tell her what I told Rosey Bath. Repeated statement. Pt's mom said she would let pt know to call me

## 2022-06-06 ENCOUNTER — Telehealth: Payer: Self-pay | Admitting: Advanced Practice Midwife

## 2022-06-06 NOTE — Progress Notes (Signed)
[facility] transfer of care referral faxed to [facility] with patient records and snapshot. Confirmation received.Burt Knack, RN

## 2022-06-06 NOTE — Telephone Encounter (Signed)
T/C to pt at [phone] to discuss high risk status of pregnancy due to cerclage placed 06/03/22 and need to transfer prenatal care to [facility] now. Questions answered. Referral written to transfer care to [facility]

## 2022-06-06 NOTE — Telephone Encounter (Signed)
T/C to pt to discuss recent circlage placement and high risk status. No answer and voicemailbox full so unable to leave message to call us back re: transfer of prenatal care

## 2022-06-08 ENCOUNTER — Telehealth: Payer: Self-pay

## 2022-06-08 NOTE — Telephone Encounter (Signed)
TC to patient to inform her that her FMLA paperwork is ready for her to pick up. Patient states she will come next week to pick it up and patient was reminded that we are closed on 06/13/2022 for Labor Day. Patient states she might need changes made to her forms due to her cerclage and counseled to take the completed forms and a set of blank forms to [facility] with her on 06/23/2022. Her maternity care has been transferred to [facility] so she can discuss her FMLA plans with her provider at [facility]. Patient states understanding. Counseled to come ring the doorbell at maternity clinic, paperwork is on nurse-to-do cart. Copy sent for scanning.Burt Knack, RN

## 2022-06-21 ENCOUNTER — Ambulatory Visit: Payer: Medicaid Other

## 2022-10-21 ENCOUNTER — Other Ambulatory Visit: Payer: Self-pay

## 2022-10-21 ENCOUNTER — Emergency Department
Admission: EM | Admit: 2022-10-21 | Discharge: 2022-10-21 | Disposition: A | Discharge status: Home / Self Care | Payer: Medicaid Other | Attending: Emergency Medicine | Admitting: Emergency Medicine

## 2022-10-21 ENCOUNTER — Emergency Department: Payer: Medicaid Other

## 2022-10-21 DIAGNOSIS — R220 Localized swelling, mass and lump, head: Secondary | ICD-10-CM | POA: Diagnosis present

## 2022-10-21 DIAGNOSIS — J020 Streptococcal pharyngitis: Secondary | ICD-10-CM | POA: Insufficient documentation

## 2022-10-21 DIAGNOSIS — Z20822 Contact with and (suspected) exposure to covid-19: Secondary | ICD-10-CM | POA: Diagnosis not present

## 2022-10-21 LAB — COMPREHENSIVE METABOLIC PANEL
ALT: 19 U/L (ref 0–44)
AST: 21 U/L (ref 15–41)
Albumin: 3.2 g/dL — ABNORMAL LOW (ref 3.5–5.0)
Alkaline Phosphatase: 75 U/L (ref 38–126)
Anion gap: 7 (ref 5–15)
BUN: 8 mg/dL (ref 6–20)
CO2: 25 mmol/L (ref 22–32)
Calcium: 8.8 mg/dL — ABNORMAL LOW (ref 8.9–10.3)
Chloride: 108 mmol/L (ref 98–111)
Creatinine, Ser: 0.94 mg/dL (ref 0.44–1.00)
GFR, Estimated: >60 mL/min (ref >60)
Glucose, Bld: 99 mg/dL (ref 70–99)
Potassium: 3.4 mmol/L — ABNORMAL LOW (ref 3.5–5.1)
Sodium: 140 mmol/L (ref 135–145)
Total Bilirubin: 0.5 mg/dL (ref 0.3–1.2)
Total Protein: 7.5 g/dL (ref 6.5–8.1)

## 2022-10-21 LAB — CBC WITH DIFFERENTIAL/PLATELET
Abs Immature Granulocytes: 0.02 10*3/uL (ref 0.00–0.07)
Basophils Absolute: 0 10*3/uL (ref 0.0–0.1)
Basophils Relative: 0 %
Eosinophils Absolute: 0.3 10*3/uL (ref 0.0–0.5)
Eosinophils Relative: 4 %
HCT: 37.4 % (ref 36.0–46.0)
Hemoglobin: 12 g/dL (ref 12.0–15.0)
Immature Granulocytes: 0 %
Lymphocytes Relative: 25 %
Lymphs Abs: 1.7 10*3/uL (ref 0.7–4.0)
MCH: 32 pg (ref 26.0–34.0)
MCHC: 32.1 g/dL (ref 30.0–36.0)
MCV: 99.7 fL (ref 80.0–100.0)
Monocytes Absolute: 0.6 10*3/uL (ref 0.1–1.0)
Monocytes Relative: 8 %
Neutro Abs: 4.2 10*3/uL (ref 1.7–7.7)
Neutrophils Relative %: 63 %
Platelets: 342 10*3/uL (ref 150–400)
RBC: 3.75 MIL/uL — ABNORMAL LOW (ref 3.87–5.11)
RDW: 12.9 % (ref 11.5–15.5)
WBC: 6.8 10*3/uL (ref 4.0–10.5)
nRBC: 0 % (ref 0.0–0.2)

## 2022-10-21 LAB — RESP PANEL BY RT-PCR (RSV, FLU A&B, COVID)  RVPGX2
Influenza A by PCR: NEGATIVE
Influenza B by PCR: NEGATIVE
Resp Syncytial Virus by PCR: NEGATIVE
SARS Coronavirus 2 by RT PCR: NEGATIVE

## 2022-10-21 LAB — GROUP A STREP BY PCR: Group A Strep by PCR: DETECTED — AB

## 2022-10-21 MED ORDER — AMOXICILLIN 500 MG PO TABS: 500.0000 mg | ORAL_TABLET | Freq: Two times a day (BID) | ORAL | 0 refills | Status: AC

## 2022-10-21 MED ORDER — IOHEXOL 300 MG/ML  SOLN
75.0000 mL | Freq: Once | INTRAMUSCULAR | Status: AC | PRN
  Administered 2022-10-21: 75 mL via INTRAVENOUS

## 2022-10-21 MED ORDER — PREDNISONE 10 MG PO TABS: ORAL_TABLET | ORAL | 0 refills | Status: AC

## 2022-10-21 NOTE — Discharge Instructions (Addendum)
-  You tested positive for strep throat.  Please take the full course of the amoxicillin as prescribed.  In addition, please take the full course of the prednisone taper as well.  -The swelling in your neck is related to enlarged lymph nodes.  These will gradually decrease in size over the next few days/weeks.  No further intervention is needed.  -Return to the emergency department anytime if you begin to experience any new or worsening symptoms.

## 2022-10-21 NOTE — ED Provider Notes (Signed)
[facility] Provider Note    Event Date/Time   First MD Initiated Contact with Patient 10/21/22 1251     (approximate)   History   Chief Complaint Facial Swelling   HPI Hayley Kirk is a 37 y.o. female, history of migraines, anxiety, depression, presents to the emergency department for evaluation of face/neck swelling.  She states that she has had viral upper respiratory infections for the past couple weeks.  However, she became concerned when she noticed significant swelling under her chin , as well as swelling on both sides of her jaw x 1 week.  She states that this has not happened to her before.  Denies fever/chills, chest pain, shortness of breath, abdominal pain, flank pain, nausea vomiting, diarrhea, urinary symptoms, weakness, restless lesions, or dysphagia.  History Limitations: No limitations.        Physical Exam  Triage Vital Signs: ED Triage Vitals  Enc Vitals Group     BP 10/21/22 1214 (!) 126/97     Pulse Rate 10/21/22 1214 80     Resp 10/21/22 1214 18     Temp 10/21/22 1214 98.1 F (36.7 C)     Temp Source 10/21/22 1214 Oral     SpO2 10/21/22 1214 97 %     Weight --      Height --      Head Circumference --      Peak Flow --      Pain Score 10/21/22 1212 8     Pain Loc --      Pain Edu? --      Excl. in GC? --     Most recent vital signs: Vitals:   10/21/22 1214 10/21/22 1545  BP: (!) 126/97 (!) 126/90  Pulse: 80 80  Resp: 18 18  Temp: 98.1 F (36.7 C) 98 F (36.7 C)  SpO2: 97% 99%    General: Awake, NAD.  Skin: Warm, dry. No rashes or lesions.  Eyes: PERRL. Conjunctivae normal.  CV: Good peripheral perfusion.  Resp: Normal effort.  Abd: Soft, non-tender. No distention.  Neuro: At baseline. No gross neurological deficits.  Musculoskeletal: Normal ROM of all extremities.  Focused Exam: Mild tonsillar swelling and erythema bilaterally.  No uvular deviation.  No exudates.  She does have an impressive amount of  lymphadenopathy in the submandibular and submental region.  Swelling does seem to extend towards the parotid glands.  No trismus.  No obvious lesions or abscesses on oral examination.  Physical Exam    ED Results / Procedures / Treatments  Labs (all labs ordered are listed, but only abnormal results are displayed) Labs Reviewed  GROUP A STREP BY PCR - Abnormal; Notable for the following components:      Result Value   Group A Strep by PCR DETECTED (*)    All other components within normal limits  CBC WITH DIFFERENTIAL/PLATELET - Abnormal; Notable for the following components:   RBC 3.75 (*)    All other components within normal limits  COMPREHENSIVE METABOLIC PANEL - Abnormal; Notable for the following components:   Potassium 3.4 (*)    Calcium 8.8 (*)    Albumin 3.2 (*)    All other components within normal limits  RESP PANEL BY RT-PCR (RSV, FLU A&B, COVID)  [id number]     EKG N/A.    RADIOLOGY  ED Provider Interpretation: I personally viewed and interpreted the CT scan, no evidence of peritonsillar or retropharyngeal abscesses.  Evidence suggestive of tonsillitis.  CT Soft Tissue Neck W Contrast  Result Date: 10/21/2022 CLINICAL DATA:  Soft tissue swelling under the chin extending to both sides of the mandible. EXAM: CT NECK WITH CONTRAST TECHNIQUE: Multidetector CT imaging of the neck was performed using the standard protocol following the bolus administration of intravenous contrast. RADIATION DOSE REDUCTION: This exam was performed according to the departmental dose-optimization program which includes automated exposure control, adjustment of the mA and/or kV according to patient size and/or use of iterative reconstruction technique. CONTRAST:  64mL OMNIPAQUE IOHEXOL 300 MG/ML  SOLN COMPARISON:  None Available. FINDINGS: Pharynx and larynx: Moderate adenoid hypertrophy is present bilaterally. The palatine tonsils are enlarged bilaterally. There is stranding in the parapharyngeal  fat bilaterally. No developing or focal abscess is present. The lingual tonsils are moderately prominent. The airway is patent. Vocal cords are midline and symmetric.  Trachea is clear. Salivary glands: The submandibular and parotid glands and ducts are within normal limits. Thyroid: Normal Lymph nodes: A right paramedian submental lymph node measures 11 x 8 x 11 mm. This corresponds with the palpable lesion. Enlarged reactive type submandibular and level 2 lymph nodes are present bilaterally. No necrotic nodes are present. Vascular: No focal vascular lesions. Limited intracranial: Within normal limits. Visualized orbits: The globes and orbits are within normal limits. Mastoids and visualized paranasal sinuses: Mild mucosal thickening is present in the inferior maxillary sinuses. Mild mucosal thickening is present in the inferior left frontal sinus. The paranasal sinuses and mastoid air cells are otherwise clear. Skeleton: Reversal of the normal cervical lordosis is likely positional. No focal osseous lesions are present. Upper chest: The lung apices are clear. IMPRESSION: 1. Enlarged palatine tonsils and lingual tonsils compatible with acute tonsillitis. 2. No developing or focal abscess. 3. Enlarged reactive type submandibular and level 2 lymph nodes bilaterally. 4. 11 x 8 x 11 mm right paramedian submental lymph node corresponds with the palpable lesion, also likely reactive. Electronically Signed   By: San Morelle M.D.   On: 10/21/2022 15:20    PROCEDURES:  Critical Care performed: N/A.  Procedures    MEDICATIONS ORDERED IN ED: Medications  iohexol (OMNIPAQUE) 300 MG/ML solution 75 mL (75 mLs Intravenous Contrast Given 10/21/22 1502)     IMPRESSION / MDM / ASSESSMENT AND PLAN / ED COURSE  I reviewed the triage vital signs and the nursing notes.                              Differential diagnosis includes, but is not limited to, strep pharyngitis, COVID-19, influenza, RSV,  peritonsillar abscess, retropharyngeal abscess, tonsillitis, lymphadenopathy.  ED Course Patient appears well, vitals within normal limits.  NAD.  Afebrile.  CBC shows no leukocytosis or anemia.  CMP shows no evidence of significant electrolyte abnormalities, transaminitis, or AKI.  Respiratory panel negative for COVID-19, influenza, or RSV.  Group A strep PCR positive.  Assessment/Plan Patient presents with swelling to the submandibular and submental space following upper respiratory symptoms.  She tested positive for group A strep.  Her CT scan does not show any evidence of peritonsillar or retropharyngeal abscess, but does show evidence of tonsillitis and significant lymphadenopathy.  She otherwise appears well clinically.  Lab workup is reassuring.  Will provide her with a prescription for amoxicillin and prednisone taper.  Recommend that she follow-up with her primary care provider as needed.  Will discharge.  Considered admission for this patient, but given her stable presentation and unremarkable workup,  she is unlikely benefit from admission.  Provided the patient with anticipatory guidance, return precautions, and educational material. Encouraged the patient to return to the emergency department at any time if they begin to experience any new or worsening symptoms. Patient expressed understanding and agreed with the plan.   Patient's presentation is most consistent with acute complicated illness / injury requiring diagnostic workup.       FINAL CLINICAL IMPRESSION(S) / ED DIAGNOSES   Final diagnoses:  Strep pharyngitis     Rx / DC Orders   ED Discharge Orders          Ordered    amoxicillin (AMOXIL) 500 MG tablet  2 times daily        10/21/22 1529    predniSONE (DELTASONE) 10 MG tablet  Q breakfast        10/21/22 1529             Note:  This document was prepared using Dragon voice recognition software and may include unintentional dictation errors.    Teodoro Spray, [place] 10/21/22 1621    Lavonia Drafts, MD 10/24/22 [phone]

## 2022-10-21 NOTE — ED Triage Notes (Addendum)
Pt states she delivered on xmas and had a cold prior to that. Pt c/o knot under her chin that started this morning, and swelling on sides of jaw bilaterally that started a week ago. Pt c/o dry mouth. Pt denies difficulty breathing/chewing. Tender mass noted directly under chin, swelling noted around the jaw area bilaterally. Skin warm, dry and intact.

## 2022-12-08 ENCOUNTER — Encounter: Payer: Self-pay | Admitting: Family

## 2022-12-08 ENCOUNTER — Ambulatory Visit (LOCAL_COMMUNITY_HEALTH_CENTER): Payer: Medicaid Other | Admitting: Family

## 2022-12-08 DIAGNOSIS — Z309 Encounter for contraceptive management, unspecified: Secondary | ICD-10-CM | POA: Diagnosis not present

## 2022-12-08 DIAGNOSIS — Z30431 Encounter for routine checking of intrauterine contraceptive device: Secondary | ICD-10-CM

## 2022-12-08 NOTE — Progress Notes (Signed)
[facility] Department  Postpartum Exam  Hayley Kirk is a 37 y.o. [phone] female who presents for a postpartum visit. She is 9 weeks postpartum following a normal spontaneous vaginal delivery.  I have fully reviewed the prenatal and intrapartum course. The delivery was at 52w1dgestational weeks.  Anesthesia: epidural. Postpartum course has been uneventful. Baby is doing well. Baby is feeding by bottle-Similac Total Comfort. Bleeding red mucoid, now started 12/02/2022. Bowel function is normal. Bladder function is normal. Patient is sexually active, last IC 3 weeks ago. Contraception method is IUD. Postpartum depression screening: negative. EPDS score=0  The pregnancy intention screening data noted above was reviewed. Potential methods of contraception were discussed after delivery and the patient elected to proceed with Liletta IUD at the hospital, placed on 10/03/2022.    Health Maintenance Due  Topic Date Due   COVID-19 Vaccine (1) Never done    All portions of the patient's history were reviewed and updated as appropriate  Review of Systems Pertinent items are noted in HPI.  Objective:  BP 136/83 (BP Location: Left Arm, Patient Position: Sitting, Cuff Size: Normal)   Pulse 73   Ht '5\' 4"'$  (1.626 m)   Wt 152 lb 12.8 oz (69.3 kg)   LMP 11/29/2022 (Approximate)   Breastfeeding No   BMI 26.23 kg/m    General:  alert, oriented, in no distress   Breasts:  not indicated  Lungs: clear to auscultation bilaterally  Heart:  regular rate and rhythm, S1, S2 normal, no murmur, click, rub or gallop  Abdomen: soft, non-tender; bowel sounds normal; no masses,  no organomegaly   Wound well healed episiotomy incision, scar not visible  GU exam:  normal, Liletta IUD strings visible from cervical os       Assessment:  1. Postpartum exam Postpartum Examination performed, breast exam not done due to not breastfeeding Pap smear due 08/14/2024 STI testing declined LMP 11/29/2022-  currently bleeding  2. Encounter for routine checking of intrauterine contraceptive device (IUD) IUD string check performed without complications, patient tolerated Discussed bleeding pattern with [place] written education information given Recheck in 1 year, or sooner if has concerns.     Plan:   Essential components of care per ACOG recommendations:  1.  Mood and well being: Patient with negative depression screening today. Reviewed local resources for support.  - Patient tobacco use? No.   - hx of drug use? No.    2. Infant care and feeding:  -Patient currently breastmilk feeding? No.  -Social determinants of health (SDOH) reviewed in EPIC. No concerns.   3. Sexuality, contraception and birth spacing - Patient does not want a pregnancy in the next year.  Desired family size is 6 children, patient now has 6 children - Reviewed reproductive life planning. Reviewed options based on patient desire and reproductive life plan. Patient is interested in IUD. This was not provided to the patient today. IUD inserted post delivery in hospital.  Risks, benefits, and typical effectiveness rates were reviewed.  Questions were answered.  Written information was also given to the patient to review.    The patient will follow up in  1 years for surveillance.  The patient was told to call with any further questions, or with any concerns about this method of contraception.  Emphasized use of condoms 100% of the time for STI prevention.  Patient was not offered ECP. - Discussed birth spacing of 18 months  4. Sleep and fatigue -Encouraged family/partner/community support of 4 hrs  of uninterrupted sleep to help with mood and fatigue  5. Physical Recovery  - Discussed patients delivery and complications. She describes her labor as bad, incomplete cerclage removal and bleeding with episiotomy. - Patient did not have a laceration. Perineal healing reviewed. Patient expressed understanding - Patient  has urinary incontinence? No. 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  6.  Health Maintenance - HM due items addressed Yes - Last pap smear  11/5/2020No results found for: "DIAGPAP" Pap smear not done at today's visit.  -Breast Cancer screening indicated? No.   7. Chronic Disease/Pregnancy Condition follow up: None  - PCP follow up  Marline Backbone, [place]

## 2022-12-16 ENCOUNTER — Telehealth: Payer: Self-pay | Admitting: Family Medicine

## 2022-12-16 NOTE — Telephone Encounter (Signed)
Pt believes that she has a UTI and has questions regarding taking OTC UTI products.  Pt informed that it would be best to see her PCP or go to an Urgent Care and have her urine tested and receive proper treatment if she does have a UTI.  Pt verbalizes understanding.  Windle Guard, RN

## 2022-12-16 NOTE — Telephone Encounter (Signed)
Pt had her PP last month and is experiencing symptoms similar to those of an UTI. She was told this are pretty common after delivering a baby, but she wants someone to call her to let her know how to proceed and/or if she needs to make an appt. Thanks

## 2022-12-22 NOTE — Addendum Note (Signed)
Addended by: Bradd Burner A on: 12/22/2022 10:30 PM   Modules accepted: Level of Service

## 2022-12-28 ENCOUNTER — Other Ambulatory Visit: Payer: Self-pay

## 2022-12-28 ENCOUNTER — Encounter: Payer: Self-pay | Admitting: Emergency Medicine

## 2022-12-28 ENCOUNTER — Emergency Department: Payer: Medicaid Other

## 2022-12-28 ENCOUNTER — Emergency Department
Admission: EM | Admit: 2022-12-28 | Discharge: 2022-12-28 | Disposition: A | Discharge status: Home / Self Care | Payer: Medicaid Other | Attending: Emergency Medicine | Admitting: Emergency Medicine

## 2022-12-28 DIAGNOSIS — R0789 Other chest pain: Secondary | ICD-10-CM | POA: Insufficient documentation

## 2022-12-28 DIAGNOSIS — Y92410 Unspecified street and highway as the place of occurrence of the external cause: Secondary | ICD-10-CM | POA: Insufficient documentation

## 2022-12-28 DIAGNOSIS — R079 Chest pain, unspecified: Secondary | ICD-10-CM

## 2022-12-28 MED ORDER — ACETAMINOPHEN 500 MG PO TABS
1000.0000 mg | ORAL_TABLET | Freq: Once | ORAL | Status: AC
  Administered 2022-12-28: 1000 mg via ORAL
  Filled 2022-12-28: qty 2
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
## 2022-12-28 NOTE — Discharge Instructions (Signed)
Pain control:  Ibuprofen (motrin/aleve/advil) - You can take 3-4 tablets (600-800 mg) every 6 hours as needed for pain/fever.  Acetaminophen (tylenol) - You can take 2 extra strength tablets (1000 mg) every 6 hours as needed for pain/fever.  You can alternate these medications or take them together.  Make sure you eat food/drink water when taking these medications. 

## 2022-12-28 NOTE — ED Notes (Signed)
Pt to xray

## 2023-01-20 ENCOUNTER — Telehealth: Payer: Self-pay | Admitting: Family Medicine

## 2023-01-20 NOTE — Telephone Encounter (Signed)
Please call me back I missed your call 

## 2023-01-23 NOTE — Telephone Encounter (Signed)
LM for pt to return my call if she still had questions.

## 2023-01-27 ENCOUNTER — Ambulatory Visit: Payer: Medicaid Other

## 2023-01-30 ENCOUNTER — Ambulatory Visit: Payer: Medicaid Other

## 2023-06-13 ENCOUNTER — Other Ambulatory Visit: Payer: Self-pay

## 2023-06-13 ENCOUNTER — Emergency Department
Admission: EM | Admit: 2023-06-13 | Discharge: 2023-06-13 | Disposition: A | Discharge status: Home / Self Care | Payer: Medicaid Other | Attending: Emergency Medicine | Admitting: Emergency Medicine

## 2023-06-13 DIAGNOSIS — R1032 Left lower quadrant pain: Secondary | ICD-10-CM | POA: Diagnosis present

## 2023-06-13 DIAGNOSIS — N390 Urinary tract infection, site not specified: Secondary | ICD-10-CM

## 2023-06-13 DIAGNOSIS — R109 Unspecified abdominal pain: Secondary | ICD-10-CM

## 2023-06-13 LAB — COMPREHENSIVE METABOLIC PANEL
ALT: 20 U/L (ref 0–44)
AST: 20 U/L (ref 15–41)
Albumin: 4.1 g/dL (ref 3.5–5.0)
Alkaline Phosphatase: 46 U/L (ref 38–126)
Anion gap: 9 (ref 5–15)
BUN: 12 mg/dL (ref 6–20)
CO2: 27 mmol/L (ref 22–32)
Calcium: 8.8 mg/dL — ABNORMAL LOW (ref 8.9–10.3)
Chloride: 103 mmol/L (ref 98–111)
Creatinine, Ser: 0.88 mg/dL (ref 0.44–1.00)
GFR, Estimated: >60 mL/min (ref >60)
Glucose, Bld: 96 mg/dL (ref 70–99)
Potassium: 3.7 mmol/L (ref 3.5–5.1)
Sodium: 139 mmol/L (ref 135–145)
Total Bilirubin: 0.8 mg/dL (ref 0.3–1.2)
Total Protein: 7.8 g/dL (ref 6.5–8.1)

## 2023-06-13 LAB — CBC
HCT: 41 % (ref 36.0–46.0)
Hemoglobin: 13.5 g/dL (ref 12.0–15.0)
MCH: 31.8 pg (ref 26.0–34.0)
MCHC: 32.9 g/dL (ref 30.0–36.0)
MCV: 96.5 fL (ref 80.0–100.0)
Platelets: 203 10*3/uL (ref 150–400)
RBC: 4.25 MIL/uL (ref 3.87–5.11)
RDW: 12.3 % (ref 11.5–15.5)
WBC: 4.7 10*3/uL (ref 4.0–10.5)
nRBC: 0 % (ref 0.0–0.2)

## 2023-06-13 LAB — LIPASE, BLOOD: Lipase: 57 U/L — ABNORMAL HIGH (ref 11–51)

## 2023-06-13 LAB — POC URINE PREG, ED: Preg Test, Ur: NEGATIVE

## 2023-06-13 LAB — URINALYSIS, ROUTINE W REFLEX MICROSCOPIC
Bilirubin Urine: NEGATIVE
Glucose, UA: NEGATIVE mg/dL
Hgb urine dipstick: NEGATIVE
Ketones, ur: NEGATIVE mg/dL
Nitrite: NEGATIVE
Protein, ur: NEGATIVE mg/dL
Specific Gravity, Urine: 1.021 (ref 1.005–1.030)
pH: 5 (ref 5.0–8.0)

## 2023-06-13 MED ORDER — CEPHALEXIN 500 MG PO CAPS: 500.0000 mg | ORAL_CAPSULE | Freq: Two times a day (BID) | ORAL | 0 refills | Status: DC

## 2023-06-13 MED ORDER — CEPHALEXIN 500 MG PO CAPS: 500.0000 mg | ORAL_CAPSULE | Freq: Two times a day (BID) | ORAL | 0 refills | Status: AC

## 2023-06-13 NOTE — ED Provider Notes (Signed)
   [facility] Provider Note    Event Date/Time   First MD Initiated Contact with Patient 06/13/23 2117     (approximate)  History   Chief Complaint: Abdominal Pain  HPI  Hayley Kirk is a 37 y.o. female with no significant past medical history presents to the emergency department for left-sided abdominal pain.  According to the patient she was coming into the emergency department tonight to have her daughters I evaluated as she noted that it was red tonight.  Patient states while she is here she also would like her abdomen evaluated states she has been experiencing pain in her left lower quadrant intermittently over the past 1 to 2 months.  Patient states she had a recent OB/GYN surgery in December and since that time has been experiencing intermittent pain to this area.  No significant worsening of the pain.  Patient denies any fever denies any urinary or vaginal symptoms.  Physical Exam   Triage Vital Signs: ED Triage Vitals  Encounter Vitals Group     BP 06/13/23 2102 115/77     Systolic BP Percentile --      Diastolic BP Percentile --      Pulse Rate 06/13/23 2102 67     Resp 06/13/23 2102 17     Temp 06/13/23 2102 98.3 F (36.8 C)     Temp Source 06/13/23 2102 Oral     SpO2 06/13/23 2102 99 %     Weight 06/13/23 2100 145 lb (65.8 kg)     Height 06/13/23 2100 5\' 4"  (1.626 m)     Head Circumference --      Peak Flow --      Pain Score 06/13/23 2059 9     Pain Loc --      Pain Education --      Exclude from Growth Chart --     Most recent vital signs: Vitals:   06/13/23 2102  BP: 115/77  Pulse: 67  Resp: 17  Temp: 98.3 F (36.8 C)  SpO2: 99%    General: Awake, no distress.  CV:  Good peripheral perfusion.  Regular rate and rhythm  Resp:  Normal effort.  Equal breath sounds bilaterally.  Abd:  No distention.  Soft, very minimal tenderness to the left lower quadrant.  No rebound or guarding.  Abdomen otherwise benign.  ED Results /  Procedures / Treatments   MEDICATIONS ORDERED IN ED: Medications - No data to display   IMPRESSION / MDM / ASSESSMENT AND PLAN / ED COURSE  I reviewed the triage vital signs and the nursing notes.  Patient's presentation is most consistent with acute presentation with potential threat to life or bodily function.  Patient presents the emergency department for 1 to 2 months of intermittent left-sided/left lower quadrant abdominal pain.  Patient states it is sharp and intermittent has been ongoing since she had an OB/GYN surgery in December.  Very minimal tenderness to palpation on my exam.  Patient's workup shows a reassuring CBC reassuring chemistry slight lipase elevation although patient has no upper abdominal discomfort.  Pregnancy test is negative.  Urinalysis shows possible mild infection.  We will cover with antibiotics.  Patient agreeable pain.  FINAL CLINICAL IMPRESSION(S) / ED DIAGNOSES   Left lower quadrant abdominal pain    Note:  This document was prepared using Dragon voice recognition software and may include unintentional dictation errors.   Minna Antis, MD 06/13/23 2232

## 2023-06-13 NOTE — ED Triage Notes (Signed)
Pt to ED via POV c/o lower left abdominal pain. Pt has an incision from a procedure when she was pregnant last December. Pt describes pain as sharp. Denies any vaginal bleeding, N/V/D, CP, SOB.

## 2023-08-07 NOTE — Progress Notes (Signed)
08-07-2023 for 08-04-23.[facility] Department received a request from [employer] for medical records for the purposes of HEDIS activities for dates of service 2023-2024 for patient Hayley Kirk, dob: 09/27/1986. Per request medical records released include the following: 02-25-22 pt visit, 03-29-22 labs, 04-28-22 AFP reslts, 05-24-22 lab results, 03-29-22 prenatal visit, 04-26-22 prenatal visit, 05-24-22 prenatal visit, 12-08-22 pp exam. Medical records were mailed certified mail to [facility], [address] [place], [place] [address]. Herby Abraham RN

## 2023-09-22 ENCOUNTER — Encounter: Payer: Self-pay | Admitting: Emergency Medicine

## 2023-09-22 ENCOUNTER — Emergency Department: Payer: Medicaid Other

## 2023-09-22 ENCOUNTER — Emergency Department
Admission: EM | Admit: 2023-09-22 | Discharge: 2023-09-22 | Discharge status: Against Medical Advice | Payer: Medicaid Other | Attending: Emergency Medicine | Admitting: Emergency Medicine

## 2023-09-22 ENCOUNTER — Other Ambulatory Visit: Payer: Self-pay

## 2023-09-22 DIAGNOSIS — R142 Eructation: Secondary | ICD-10-CM | POA: Diagnosis not present

## 2023-09-22 DIAGNOSIS — Z5321 Procedure and treatment not carried out due to patient leaving prior to being seen by health care provider: Secondary | ICD-10-CM | POA: Insufficient documentation

## 2023-09-22 DIAGNOSIS — R42 Dizziness and giddiness: Secondary | ICD-10-CM | POA: Insufficient documentation

## 2023-09-22 DIAGNOSIS — R079 Chest pain, unspecified: Secondary | ICD-10-CM | POA: Insufficient documentation

## 2023-09-22 LAB — CBC
HCT: 40.7 % (ref 36.0–46.0)
Hemoglobin: 13.4 g/dL (ref 12.0–15.0)
MCH: 31.5 pg (ref 26.0–34.0)
MCHC: 32.9 g/dL (ref 30.0–36.0)
MCV: 95.8 fL (ref 80.0–100.0)
Platelets: 207 10*3/uL (ref 150–400)
RBC: 4.25 MIL/uL (ref 3.87–5.11)
RDW: 12.7 % (ref 11.5–15.5)
WBC: 5.8 10*3/uL (ref 4.0–10.5)
nRBC: 0 % (ref 0.0–0.2)

## 2023-09-22 LAB — BASIC METABOLIC PANEL
Anion gap: 8 (ref 5–15)
BUN: 12 mg/dL (ref 6–20)
CO2: 24 mmol/L (ref 22–32)
Calcium: 9.3 mg/dL (ref 8.9–10.3)
Chloride: 99 mmol/L (ref 98–111)
Creatinine, Ser: 0.87 mg/dL (ref 0.44–1.00)
GFR, Estimated: >60 mL/min (ref >60)
Glucose, Bld: 93 mg/dL (ref 70–99)
Potassium: 3.8 mmol/L (ref 3.5–5.1)
Sodium: 131 mmol/L — ABNORMAL LOW (ref 135–145)

## 2023-09-22 LAB — TROPONIN I (HIGH SENSITIVITY): Troponin I (High Sensitivity): <2 ng/L (ref <18)

## 2023-09-22 LAB — POC URINE PREG, ED: Preg Test, Ur: NEGATIVE

## 2023-09-22 NOTE — ED Notes (Signed)
EKG performed and showed STEMI EDT Zach took it to MD [facility] and she requested him to rerun it and do another one.

## 2023-09-22 NOTE — ED Notes (Signed)
Correction to Maralyn Sago, RN note earlier. This tech took the EKG to MD Ray, not MD [facility]. And she requested to do another one. Another one was done and given to MD Ray.

## 2023-09-22 NOTE — ED Triage Notes (Signed)
Pt to ED from home c/o mid chest pain radiating to bilat ribs and between shoulder blades x3 days, states some dizziness, also burping.  Pt states did a new work out and has been eating "a lot of fast food recently".  States took [place] which helped.  Pt A&Ox4, chest rise even and unlabored, skin WNL and in NAD at this time.

## 2023-09-24 ENCOUNTER — Other Ambulatory Visit: Payer: Self-pay

## 2023-09-24 ENCOUNTER — Emergency Department
Admission: EM | Admit: 2023-09-24 | Discharge: 2023-09-24 | Disposition: A | Discharge status: Home / Self Care | Payer: Medicaid Other | Attending: Emergency Medicine | Admitting: Emergency Medicine

## 2023-09-24 ENCOUNTER — Emergency Department: Payer: Medicaid Other

## 2023-09-24 DIAGNOSIS — R079 Chest pain, unspecified: Secondary | ICD-10-CM | POA: Diagnosis present

## 2023-09-24 DIAGNOSIS — R0789 Other chest pain: Secondary | ICD-10-CM | POA: Insufficient documentation

## 2023-09-24 LAB — BASIC METABOLIC PANEL
Anion gap: 5 (ref 5–15)
BUN: 15 mg/dL (ref 6–20)
CO2: 28 mmol/L (ref 22–32)
Calcium: 9.3 mg/dL (ref 8.9–10.3)
Chloride: 102 mmol/L (ref 98–111)
Creatinine, Ser: 0.86 mg/dL (ref 0.44–1.00)
GFR, Estimated: >60 mL/min (ref >60)
Glucose, Bld: 85 mg/dL (ref 70–99)
Potassium: 3.9 mmol/L (ref 3.5–5.1)
Sodium: 135 mmol/L (ref 135–145)

## 2023-09-24 LAB — CBC
HCT: 41.5 % (ref 36.0–46.0)
Hemoglobin: 13.9 g/dL (ref 12.0–15.0)
MCH: 31.7 pg (ref 26.0–34.0)
MCHC: 33.5 g/dL (ref 30.0–36.0)
MCV: 94.5 fL (ref 80.0–100.0)
Platelets: 212 10*3/uL (ref 150–400)
RBC: 4.39 MIL/uL (ref 3.87–5.11)
RDW: 12.5 % (ref 11.5–15.5)
WBC: 5 10*3/uL (ref 4.0–10.5)
nRBC: 0 % (ref 0.0–0.2)

## 2023-09-24 LAB — TROPONIN I (HIGH SENSITIVITY): Troponin I (High Sensitivity): <2 ng/L (ref <18)

## 2023-09-24 MED ORDER — PANTOPRAZOLE SODIUM 40 MG PO TBEC: 40.0000 mg | DELAYED_RELEASE_TABLET | Freq: Every day | ORAL | 1 refills | Status: AC

## 2023-09-24 NOTE — ED Provider Notes (Signed)
[facility] Provider Note    Event Date/Time   First MD Initiated Contact with Patient 09/24/23 1231     (approximate)   History   Chest Pain   HPI  Hayley Kirk is a 37 y.o. female who presents with intermittent chest pain over the last 7 days, she reports she was here 2 days ago but left without being seen.  Denies fevers or chills, no cough, no shortness of breath.  Discomfort in the chest is intermittent, no significant radiation but does describe some back discomfort.  She thinks it may be related to acid reflux     Physical Exam   Triage Vital Signs: ED Triage Vitals  Encounter Vitals Group     BP 09/24/23 1117 (!) 116/90     Systolic BP Percentile --      Diastolic BP Percentile --      Pulse Rate 09/24/23 1117 79     Resp 09/24/23 1117 16     Temp 09/24/23 1117 98 F (36.7 C)     Temp Source 09/24/23 1117 Oral     SpO2 09/24/23 1117 99 %     Weight 09/24/23 1115 69.9 kg (154 lb)     Height --      Head Circumference --      Peak Flow --      Pain Score 09/24/23 1115 8     Pain Loc --      Pain Education --      Exclude from Growth Chart --     Most recent vital signs: Vitals:   09/24/23 1117  BP: (!) 116/90  Pulse: 79  Resp: 16  Temp: 98 F (36.7 C)  SpO2: 99%     General: Awake, no distress.  Here with daughter CV:  Good peripheral perfusion.  The rate and rhythm Resp:  Normal effort.  Abd:  No distention.  Other:  Normal pulses in the upper extremity   ED Results / Procedures / Treatments   Labs (all labs ordered are listed, but only abnormal results are displayed) Labs Reviewed  BASIC METABOLIC PANEL  CBC  POC URINE PREG, ED  TROPONIN I (HIGH SENSITIVITY)     EKG  ED ECG REPORT I, Jene Every, the attending physician, personally viewed and interpreted this ECG.  Date: 09/24/2023  Rhythm: normal sinus rhythm QRS Axis: normal Intervals: normal ST/T Wave abnormalities: Nonspecific  changes Narrative Interpretation: no evidence of acute ischemia    RADIOLOGY Chest x-ray viewed interpret by me, no acute abnormality    PROCEDURES:  Critical Care performed:   Procedures   MEDICATIONS ORDERED IN ED: Medications - No data to display   IMPRESSION / MDM / ASSESSMENT AND PLAN / ED COURSE  I reviewed the triage vital signs and the nursing notes. Patient's presentation is most consistent with acute presentation with potential threat to life or bodily function.  Patient presents with intermittent chest discomfort as detailed above, doubt ACS given her age, EKG overall reassuring, high sensitive troponin normal.  Differential includes GERD/esophagitis, she does describe some back discomfort as well  Given this we will send for CT angiography   Initially plan was to obtain CT angiography however patient decided that she did not want this, she understands the risks.  She would like to try Protonix prescription and follow-up with cardiology as needed.  She knows she can return at any time      FINAL CLINICAL IMPRESSION(S) / ED DIAGNOSES  Final diagnoses:  Atypical chest pain     Rx / DC Orders   ED Discharge Orders          Ordered    Ambulatory referral to Cardiology       Comments: If you have not heard from the Cardiology office within the next 72 hours please call [phone].   09/24/23 1311    pantoprazole (PROTONIX) 40 MG tablet  Daily        09/24/23 1311             Note:  This document was prepared using Dragon voice recognition software and may include unintentional dictation errors.   Jene Every, MD 09/24/23 1352

## 2023-09-24 NOTE — ED Triage Notes (Signed)
C/O chest pain x 7 days.  Presented on Friday night, but LWBS.  States pain persists.  STates pain in intermittent.  No current SOB/DOE.  C/O central chest pressure. No aggravating or alleviating factors.

## 2023-12-06 ENCOUNTER — Ambulatory Visit: Payer: Medicaid Other | Attending: Cardiology | Admitting: Cardiology
